# Patient Record
Sex: Male | Born: 1975 | Hispanic: Yes | Marital: Single | Smoking: Never smoker
Health system: Southern US, Community
[De-identification: ages and names within clinical notes are randomized; demographics above are authoritative.]

## PROBLEM LIST (undated history)

## (undated) DIAGNOSIS — F32A Depression, unspecified: Secondary | ICD-10-CM

## (undated) DIAGNOSIS — K2970 Gastritis, unspecified, without bleeding: Secondary | ICD-10-CM

---

## 2021-04-04 ENCOUNTER — Other Ambulatory Visit: Payer: Self-pay

## 2021-04-04 ENCOUNTER — Ambulatory Visit (HOSPITAL_COMMUNITY)
Admission: EM | Admit: 2021-04-04 | Discharge: 2021-04-04 | Disposition: A | Discharge status: Home / Self Care | Payer: Self-pay | Attending: Family Medicine | Admitting: Family Medicine

## 2021-04-04 ENCOUNTER — Encounter (HOSPITAL_COMMUNITY): Payer: Self-pay

## 2021-04-04 DIAGNOSIS — R519 Headache, unspecified: Secondary | ICD-10-CM

## 2021-04-04 DIAGNOSIS — R030 Elevated blood-pressure reading, without diagnosis of hypertension: Secondary | ICD-10-CM

## 2021-04-04 MED ORDER — AMLODIPINE BESYLATE 2.5 MG PO TABS: 2.5000 mg | ORAL_TABLET | Freq: Every day | ORAL | 0 refills | Status: DC

## 2021-04-04 NOTE — ED Triage Notes (Addendum)
Pt reports frequent headaches along with pain in back of neck lately and would like blood pressure checked. Reports has been having headaches for about 2 days. Denies dizziness or visual abnormalities but endorses a lot of sweating.

## 2021-04-04 NOTE — ED Notes (Signed)
Triage completed with assistance from spanish interpreter Katherinne.

## 2021-04-04 NOTE — ED Provider Notes (Signed)
[facility]    CSN: [id number] Arrival date & time: 04/04/21  0848      History   Chief Complaint Chief Complaint  Patient presents with   Headache    HPI Jayvion Stefanski is a 45 y.o. male.   Medical interpreter utilized today to facilitate visit with patient's consent.  Patient states he is concerned about his blood pressure being high and has had posterior headaches the past 2 days that have been mild and intermittent.  He states when he lived in [place] he used to have issues during the summer months when it was hot outside with his blood pressure elevating and he would have similar headaches.  He has not seen a primary care since that time and has been off medications.  Does not recall what medication he used to take but states he used to take something for a period of time for his elevated blood pressure in [place].  He denies vision changes, dizziness, chest pain, shortness of breath, palpitations, recent injuries to the neck or head, worsening pain with movement.  Has not tried anything over-the-counter for symptoms thus far.   History reviewed. No pertinent past medical history.  There are no problems to display for this patient.   History reviewed. No pertinent surgical history.     Home Medications    Prior to Admission medications   Medication Sig Start Date End Date Taking? Authorizing Provider  amLODipine (NORVASC) 2.5 MG tablet Take 1 tablet (2.5 mg total) by mouth daily. 04/04/21  Yes Particia Nearing, PA-C    Family History History reviewed. No pertinent family history.  Social History Social History   Tobacco Use   Smoking status: Never   Smokeless tobacco: Never  Substance Use Topics   Alcohol use: Yes    Comment: occasional beer   Drug use: Not Currently     Allergies   Patient has no known allergies.   Review of Systems Review of Systems Per HPI  Physical Exam Triage Vital Signs ED Triage Vitals  Enc Vitals  Group     BP 04/04/21 0908 (!) 147/95     Pulse Rate 04/04/21 0908 81     Resp 04/04/21 0908 18     Temp 04/04/21 0908 99.6 F (37.6 C)     Temp Source 04/04/21 0908 Oral     SpO2 04/04/21 0908 97 %     Weight --      Height --      Head Circumference --      Peak Flow --      Pain Score 04/04/21 0909 3     Pain Loc --      Pain Edu? --      Excl. in GC? --    No data found.  Updated Vital Signs BP (!) 147/95   Pulse 81   Temp 99.6 F (37.6 C) (Oral)   Resp 18   SpO2 97%   Visual Acuity Right Eye Distance:   Left Eye Distance:   Bilateral Distance:    Right Eye Near:   Left Eye Near:    Bilateral Near:     Physical Exam Vitals and nursing note reviewed.  Constitutional:      Appearance: Normal appearance.  HENT:     Head: Atraumatic.     Mouth/Throat:     Mouth: Mucous membranes are moist.  Eyes:     Extraocular Movements: Extraocular movements intact.     Conjunctiva/sclera: Conjunctivae normal.  Cardiovascular:     Rate and Rhythm: Normal rate and regular rhythm.     Heart sounds: Normal heart sounds.  Pulmonary:     Effort: Pulmonary effort is normal.     Breath sounds: Normal breath sounds. No wheezing or rales.  Musculoskeletal:        General: No swelling or tenderness. Normal range of motion.     Cervical back: Normal range of motion and neck supple.     Comments: No midline C-spine tenderness palpation, good range of motion in all directions.  Strength full and equal upper extremities bilaterally  Skin:    General: Skin is warm and dry.  Neurological:     General: No focal deficit present.     Mental Status: He is oriented to person, place, and time.     Cranial Nerves: No cranial nerve deficit.     Motor: No weakness.     Gait: Gait normal.  Psychiatric:        Mood and Affect: Mood normal.        Thought Content: Thought content normal.        Judgment: Judgment normal.     UC Treatments / Results  Labs (all labs ordered are listed,  but only abnormal results are displayed) Labs Reviewed - No data to display  EKG   Radiology No results found.  Procedures Procedures (including critical care time)  Medications Ordered in UC Medications - No data to display  Initial Impression / Assessment and Plan / UC Course  I have reviewed the triage vital signs and the nursing notes.  Pertinent labs & imaging results that were available during my care of the patient were reviewed by me and considered in my medical decision making (see chart for details).     Blood pressure mildly elevated today, significant past medical history available for review and comparison.  Patient subjectively states he has been on blood pressure medication in the past in [place] and has episodic issues with elevated blood pressure readings and historically has similar headaches with this.  He does not feel his headache is muscular at this time, he feels very confident that is related to his blood pressure.  Discussed concerns about safety issues with possibly dropping blood pressure too low by treating a very mildly elevated blood pressure.  He is agreeable to trying a very low-dose of amlodipine at 2.5 mg and closely monitoring home blood pressures.  PCP assistance initiated to help with follow-up and ongoing monitoring.  Over-the-counter pain relievers also recommended for his headache and he knows to follow-up immediately if symptoms worsening or not improving.  Final Clinical Impressions(s) / UC Diagnoses   Final diagnoses:  Elevated blood pressure reading  Acute nonintractable headache, unspecified headache type   Discharge Instructions   None    ED Prescriptions     Medication Sig Dispense Auth. Provider   amLODipine (NORVASC) 2.5 MG tablet Take 1 tablet (2.5 mg total) by mouth daily. 14 tablet Particia Nearing, [place]      PDMP not reviewed this encounter.   Particia Nearing, [place] 04/04/21 1206

## 2021-10-30 ENCOUNTER — Other Ambulatory Visit: Payer: Self-pay

## 2021-10-30 ENCOUNTER — Emergency Department (HOSPITAL_COMMUNITY)
Admission: EM | Admit: 2021-10-30 | Discharge: 2021-10-30 | Disposition: A | Discharge status: Home / Self Care | Payer: Self-pay | Attending: Emergency Medicine | Admitting: Emergency Medicine

## 2021-10-30 ENCOUNTER — Emergency Department (HOSPITAL_COMMUNITY): Payer: Self-pay

## 2021-10-30 ENCOUNTER — Encounter (HOSPITAL_COMMUNITY): Payer: Self-pay

## 2021-10-30 DIAGNOSIS — K2100 Gastro-esophageal reflux disease with esophagitis, without bleeding: Secondary | ICD-10-CM | POA: Insufficient documentation

## 2021-10-30 DIAGNOSIS — Z79899 Other long term (current) drug therapy: Secondary | ICD-10-CM | POA: Insufficient documentation

## 2021-10-30 LAB — BASIC METABOLIC PANEL
Anion gap: 4 — ABNORMAL LOW (ref 5–15)
BUN: 15 mg/dL (ref 6–20)
CO2: 27 mmol/L (ref 22–32)
Calcium: 8.6 mg/dL — ABNORMAL LOW (ref 8.9–10.3)
Chloride: 104 mmol/L (ref 98–111)
Creatinine, Ser: 0.92 mg/dL (ref 0.61–1.24)
GFR, Estimated: >60 mL/min (ref >60)
Glucose, Bld: 136 mg/dL — ABNORMAL HIGH (ref 70–99)
Potassium: 3.8 mmol/L (ref 3.5–5.1)
Sodium: 135 mmol/L (ref 135–145)

## 2021-10-30 LAB — CBC
HCT: 50.3 % (ref 39.0–52.0)
Hemoglobin: 17.5 g/dL — ABNORMAL HIGH (ref 13.0–17.0)
MCH: 31.8 pg (ref 26.0–34.0)
MCHC: 34.8 g/dL (ref 30.0–36.0)
MCV: 91.5 fL (ref 80.0–100.0)
Platelets: 240 10*3/uL (ref 150–400)
RBC: 5.5 MIL/uL (ref 4.22–5.81)
RDW: 12.5 % (ref 11.5–15.5)
WBC: 5.8 10*3/uL (ref 4.0–10.5)
nRBC: 0 % (ref 0.0–0.2)

## 2021-10-30 LAB — TROPONIN I (HIGH SENSITIVITY): Troponin I (High Sensitivity): 3 ng/L (ref <18)

## 2021-10-30 MED ORDER — PANTOPRAZOLE SODIUM 40 MG PO TBEC
40.0000 mg | DELAYED_RELEASE_TABLET | Freq: Once | ORAL | Status: AC
  Administered 2021-10-30: 40 mg via ORAL
  Filled 2021-10-30: qty 1

## 2021-10-30 MED ORDER — OMEPRAZOLE 40 MG PO CPDR: 40.0000 mg | DELAYED_RELEASE_CAPSULE | Freq: Every day | ORAL | 0 refills | Status: DC

## 2021-10-30 NOTE — ED Triage Notes (Addendum)
Pt presents to ED with complaints of chest pain on the right side of his chest. Pt states feels numb on the right side of his chest. Pt states he had an injury (fall on wood) on the same side a year ago and unsure if it is related. Pt states pain started 2 days ago. Interpreter [phone]

## 2021-10-30 NOTE — ED Provider Notes (Signed)
[facility] EMERGENCY DEPARTMENT Provider Note   CSN: [id number] Arrival date & time: 10/30/21  1515     History  Chief Complaint  Patient presents with   Chest Pain    Barry Zamora is a 46 y.o. male.   Chest Pain Associated symptoms: no cough, no dysphagia, no fever, no nausea, no palpitations, no shortness of breath and no vomiting      Barry Zamora is a 46 y.o. male who presents to the Emergency Department complaining of burning chest pain and throat pain.  Symptoms began 2 days ago.  He describes having a tingling sensation on the right side of his chest as well.  He reports similar symptoms in the past in which he was told he had gastritis and prescribed Prilosec which helped temporarily but he ran out of the medication ans does not currently have a PCP.  He is also concerned that he fell and injured his chest a year ago and he wonders if the injury has anything to do with his current symptoms   Home Medications Prior to Admission medications   Medication Sig Start Date End Date Taking? Authorizing Provider  amLODipine (NORVASC) 2.5 MG tablet Take 1 tablet (2.5 mg total) by mouth daily. 04/04/21   Particia Nearing, PA-C      Allergies    Patient has no known allergies.    Review of Systems   Review of Systems  Constitutional:  Negative for chills and fever.  HENT:  Negative for sore throat and trouble swallowing.   Respiratory:  Negative for cough, shortness of breath and wheezing.   Cardiovascular:  Positive for chest pain. Negative for palpitations.  Gastrointestinal:  Negative for diarrhea, nausea and vomiting.  Skin:  Negative for rash.  All other systems reviewed and are negative.  Physical Exam Updated Vital Signs BP 130/78 (BP Location: Left Arm)    Pulse 66    Temp 99.4 F (37.4 C) (Oral)    Resp 16    Ht 5' 4.96" (1.65 m)    Wt 84.5 kg    SpO2 98%    BMI 31.02 kg/m  Physical Exam Vitals and nursing note reviewed.   Constitutional:      Appearance: Normal appearance. He is well-developed. He is not ill-appearing, toxic-appearing or diaphoretic.  HENT:     Mouth/Throat:     Mouth: Mucous membranes are moist.     Pharynx: Oropharynx is clear.  Cardiovascular:     Rate and Rhythm: Normal rate and regular rhythm.     Pulses: Normal pulses.  Pulmonary:     Effort: Pulmonary effort is normal. No respiratory distress.  Abdominal:     Palpations: Abdomen is soft.     Tenderness: There is no abdominal tenderness.  Musculoskeletal:        General: Normal range of motion.     Right lower leg: No edema.     Left lower leg: No edema.  Skin:    General: Skin is warm.     Findings: No rash.  Neurological:     General: No focal deficit present.     Mental Status: He is alert.     Motor: No weakness.  Psychiatric:        Mood and Affect: Mood normal.    ED Results / Procedures / Treatments   Labs (all labs ordered are listed, but only abnormal results are displayed) Labs Reviewed  BASIC METABOLIC PANEL - Abnormal; Notable for the following components:  Result Value   Glucose, Bld 136 (*)    Calcium 8.6 (*)    Anion gap 4 (*)    All other components within normal limits  CBC - Abnormal; Notable for the following components:   Hemoglobin 17.5 (*)    All other components within normal limits  TROPONIN I (HIGH SENSITIVITY)  TROPONIN I (HIGH SENSITIVITY)    EKG EKG Interpretation  Date/Time:  Wednesday October 30 2021 15:37:17 EST Ventricular Rate:  73 PR Interval:  126 QRS Duration: 92 QT Interval:  384 QTC Calculation: 423 R Axis:   91 Text Interpretation: Normal sinus rhythm Rightward axis Borderline ECG No previous ECGs available Confirmed by Mancel Bale [phone]) on 10/30/2021 3:42:07 PM  Radiology DG Chest 2 View  Result Date: 10/30/2021 CLINICAL DATA:  Right-sided chest pain, chest numbness, history of remote trauma EXAM: CHEST - 2 VIEW COMPARISON:  None. FINDINGS: Frontal and  lateral views of the chest demonstrate an unremarkable cardiac silhouette. No acute airspace disease, effusion, or pneumothorax. There are no acute bony abnormalities. IMPRESSION: 1. No acute intrathoracic process. Electronically Signed   By: Sharlet Salina M.D.   On: 10/30/2021 16:19    Procedures Procedures    Medications Ordered in ED Medications - No data to display  ED Course/ Medical Decision Making/ A&P                           Medical Decision Making Patient here with 3-day history of chest pain.  Describes pain as burning sensation from abdomen up through his chest.  Symptoms wax and wane.  No prior history of coronary artery disease.  Patient is a non-smoker.  Differential diagnosis would include MI, coronary artery disease, PE, GERD,  Denies any chest pain at this time.  Amount and/or Complexity of Data Reviewed Independent Historian:     Details: History provided by patient through interpreter External Data Reviewed:     Details: No external data available for review Labs: ordered.    Details: Labs interpreted by me show no leukocytosis, electrolytes reassuring, blood glucose is 136.  No history of diabetes.  troponin of 3 Radiology: ordered. ECG/medicine tests:  Decision-making details documented in ED Course.  Risk Prescription drug management.   Patient has heart score of 1  Pt with likely esophagitis.  Doubt cardiac process.  EKG and trop reassuring.  Will start him back on his Prilosec and he will be given f/u info for him to establish primary care.  He agrees to plan.  Return precautions discussed.          Final Clinical Impression(s) / ED Diagnoses Final diagnoses:  Gastroesophageal reflux disease with esophagitis without hemorrhage    Rx / DC Orders ED Discharge Orders     None         Pauline Aus, PA-C 11/01/21 2319    Mancel Bale, MD 11/02/21 2309

## 2021-10-30 NOTE — Discharge Instructions (Addendum)
Take the medication as directed.  You may call the provider listed to arrange an appointment to establish primary care.  Please return to the emergency department for any new or worsening symptoms.

## 2022-07-19 ENCOUNTER — Emergency Department (HOSPITAL_COMMUNITY)
Admission: EM | Admit: 2022-07-19 | Discharge: 2022-07-19 | Disposition: A | Discharge status: Home / Self Care | Payer: Self-pay | Attending: Emergency Medicine | Admitting: Emergency Medicine

## 2022-07-19 ENCOUNTER — Encounter (HOSPITAL_COMMUNITY): Payer: Self-pay

## 2022-07-19 ENCOUNTER — Emergency Department (HOSPITAL_COMMUNITY): Payer: Self-pay

## 2022-07-19 ENCOUNTER — Other Ambulatory Visit: Payer: Self-pay

## 2022-07-19 DIAGNOSIS — K2900 Acute gastritis without bleeding: Secondary | ICD-10-CM | POA: Insufficient documentation

## 2022-07-19 DIAGNOSIS — K2970 Gastritis, unspecified, without bleeding: Secondary | ICD-10-CM

## 2022-07-19 HISTORY — DX: Gastritis, unspecified, without bleeding: K29.70

## 2022-07-19 LAB — CBC
HCT: 50.9 % (ref 39.0–52.0)
Hemoglobin: 18.2 g/dL — ABNORMAL HIGH (ref 13.0–17.0)
MCH: 31.9 pg (ref 26.0–34.0)
MCHC: 35.8 g/dL (ref 30.0–36.0)
MCV: 89.1 fL (ref 80.0–100.0)
Platelets: 241 10*3/uL (ref 150–400)
RBC: 5.71 MIL/uL (ref 4.22–5.81)
RDW: 12.1 % (ref 11.5–15.5)
WBC: 6.5 10*3/uL (ref 4.0–10.5)
nRBC: 0 % (ref 0.0–0.2)

## 2022-07-19 LAB — URINALYSIS, ROUTINE W REFLEX MICROSCOPIC
Bilirubin Urine: NEGATIVE
Glucose, UA: NEGATIVE mg/dL
Hgb urine dipstick: NEGATIVE
Ketones, ur: NEGATIVE mg/dL
Leukocytes,Ua: NEGATIVE
Nitrite: NEGATIVE
Protein, ur: NEGATIVE mg/dL
Specific Gravity, Urine: 1.005 (ref 1.005–1.030)
pH: 7 (ref 5.0–8.0)

## 2022-07-19 LAB — COMPREHENSIVE METABOLIC PANEL
ALT: 26 U/L (ref 0–44)
AST: 24 U/L (ref 15–41)
Albumin: 4.6 g/dL (ref 3.5–5.0)
Alkaline Phosphatase: 65 U/L (ref 38–126)
Anion gap: 6 (ref 5–15)
BUN: 13 mg/dL (ref 6–20)
CO2: 28 mmol/L (ref 22–32)
Calcium: 9.2 mg/dL (ref 8.9–10.3)
Chloride: 103 mmol/L (ref 98–111)
Creatinine, Ser: 0.98 mg/dL (ref 0.61–1.24)
GFR, Estimated: >60 mL/min (ref >60)
Glucose, Bld: 118 mg/dL — ABNORMAL HIGH (ref 70–99)
Potassium: 3.5 mmol/L (ref 3.5–5.1)
Sodium: 137 mmol/L (ref 135–145)
Total Bilirubin: 1.1 mg/dL (ref 0.3–1.2)
Total Protein: 8.1 g/dL (ref 6.5–8.1)

## 2022-07-19 LAB — LIPASE, BLOOD: Lipase: 36 U/L (ref 11–51)

## 2022-07-19 MED ORDER — ALUM & MAG HYDROXIDE-SIMETH 200-200-20 MG/5ML PO SUSP
30.0000 mL | Freq: Once | ORAL | Status: AC
  Administered 2022-07-19: 30 mL via ORAL
  Filled 2022-07-19: qty 30

## 2022-07-19 MED ORDER — IOHEXOL 300 MG/ML  SOLN
100.0000 mL | Freq: Once | INTRAMUSCULAR | Status: AC | PRN
  Administered 2022-07-19: 100 mL via INTRAVENOUS

## 2022-07-19 MED ORDER — PANTOPRAZOLE SODIUM 20 MG PO TBEC: 20.0000 mg | DELAYED_RELEASE_TABLET | Freq: Every day | ORAL | 2 refills | Status: DC

## 2022-07-19 NOTE — ED Triage Notes (Signed)
Pt reports hx of gastritis. Reports stomach ache. Pt is requesting a [place] and blood work. Pt reports increase in pain. Pt wants to be checked for an ulcer or a possible appendix issue.   Denies n/v

## 2022-07-19 NOTE — ED Notes (Signed)
Patient transported to CT 

## 2022-07-19 NOTE — ED Provider Notes (Signed)
[facility] EMERGENCY DEPARTMENT Provider Note   CSN: [id number] Arrival date & time: 07/19/22  1223     History  Chief Complaint  Patient presents with   Abdominal Pain    Margarita Bobrowski is a 46 y.o. male.  Patient complains of abdominal pain.  Patient reports he feels like he has something wrong.  Patient thinks he may have an ulcer or something wrong with his appendix.  Patient reports he has been on medication in the past for gastritis.  Patient denies fever or chills he denies nausea or vomiting he denies any diarrhea.  Patient denies any burning with urination.  The history is provided by the patient. The history is limited by a language barrier. A language interpreter was used.  Abdominal Pain Pain location:  Epigastric and periumbilical Pain quality: aching   Pain radiates to:  Does not radiate Pain severity:  Moderate Onset quality:  Gradual Duration:  4 days Timing:  Constant Progression:  Worsening Chronicity:  New Context: not alcohol use, not diet changes and not suspicious food intake   Relieved by:  Nothing Worsened by:  Nothing Risk factors: no alcohol abuse        Home Medications Prior to Admission medications   Medication Sig Start Date End Date Taking? Authorizing Provider  pantoprazole (PROTONIX) 20 MG tablet Take 1 tablet (20 mg total) by mouth daily. 07/19/22 07/19/23 Yes Cheron Schaumann K, PA-C  amLODipine (NORVASC) 2.5 MG tablet Take 1 tablet (2.5 mg total) by mouth daily. 04/04/21   Particia Nearing, PA-C  omeprazole (PRILOSEC) 40 MG capsule Take 1 capsule (40 mg total) by mouth daily. 10/30/21   Triplett, Tammy, PA-C      Allergies    Patient has no known allergies.    Review of Systems   Review of Systems  Gastrointestinal:  Positive for abdominal pain.  All other systems reviewed and are negative.   Physical Exam Updated Vital Signs BP 128/78 (BP Location: Left Arm)   Pulse 61   Temp 99 F (37.2 C) (Oral)   Resp  16   Ht 5\' 4"  (1.626 m)   Wt 81.4 kg   SpO2 99%   BMI 30.79 kg/m  Physical Exam Vitals and nursing note reviewed.  Constitutional:      Appearance: He is well-developed.  HENT:     Head: Normocephalic.  Cardiovascular:     Rate and Rhythm: Normal rate and regular rhythm.  Pulmonary:     Effort: Pulmonary effort is normal.  Abdominal:     General: Bowel sounds are normal. There is abdominal bruit. There is no distension.     Palpations: Abdomen is soft.     Tenderness: There is abdominal tenderness.  Musculoskeletal:        General: Normal range of motion.     Cervical back: Normal range of motion.  Skin:    General: Skin is warm.  Neurological:     General: No focal deficit present.     Mental Status: He is alert and oriented to person, place, and time.  Psychiatric:        Mood and Affect: Mood normal.     ED Results / Procedures / Treatments   Labs (all labs ordered are listed, but only abnormal results are displayed) Labs Reviewed  COMPREHENSIVE METABOLIC PANEL - Abnormal; Notable for the following components:      Result Value   Glucose, Bld 118 (*)    All other components within normal limits  CBC - Abnormal; Notable for the following components:   Hemoglobin 18.2 (*)    All other components within normal limits  URINALYSIS, ROUTINE W REFLEX MICROSCOPIC - Abnormal; Notable for the following components:   Color, Urine STRAW (*)    All other components within normal limits  LIPASE, BLOOD    EKG None  Radiology CT ABDOMEN PELVIS W CONTRAST  Result Date: 07/19/2022 CLINICAL DATA:  Abdominal pain, acute nonlocalized. EXAM: CT ABDOMEN AND PELVIS WITH CONTRAST TECHNIQUE: Multidetector CT imaging of the abdomen and pelvis was performed using the standard protocol following bolus administration of intravenous contrast. RADIATION DOSE REDUCTION: This exam was performed according to the departmental dose-optimization program which includes automated exposure  control, adjustment of the mA and/or kV according to patient size and/or use of iterative reconstruction technique. CONTRAST:  149mL OMNIPAQUE IOHEXOL 300 MG/ML  SOLN COMPARISON:  None Available. FINDINGS: Lower chest: No acute abnormality. Hepatobiliary: Bilobar tiny hypodense hepatic lesions are technically too small to accurately characterize but statistically likely to reflect a benign etiology such as cysts or hemangiomas. Gallbladder is unremarkable. No biliary ductal dilation. Pancreas: Mild stranding along the pancreaticoduodenal groove. No pancreatic ductal dilation. Spleen: No splenomegaly. Adrenals/Urinary Tract: Bilateral adrenal glands appear normal. No hydronephrosis. Kidneys demonstrate symmetric enhancement. Urinary bladder is unremarkable for degree of distension. Stomach/Bowel: No radiopaque enteric contrast material was administered. Mild wall thickening versus underdistention of the gastric antrum. No pathologic dilation of small or large bowel. The appendix and terminal ileum appear normal. No evidence of acute bowel inflammation. Vascular/Lymphatic: Normal caliber abdominal aorta. No pathologically enlarged abdominal or pelvic lymph nodes. Reproductive: Prostate is unremarkable. Other: No significant abdominopelvic free fluid. Musculoskeletal: No acute osseous abnormality. Lower lumbar spondylosis. IMPRESSION: Mild wall thickening versus underdistention of the gastric antrum. Correlate for gastritis. If continued concern for ulcerative disease would suggest further evaluation with nonemergent outpatient upper endoscopy. Mild stranding along the pancreaticoduodenal groove may reflect pancreatitis or duodenitis, correlation with serum lipase suggested. Electronically Signed   By: Dahlia Bailiff M.D.   On: 07/19/2022 15:27    Procedures Procedures    Medications Ordered in ED Medications  alum & mag hydroxide-simeth (MAALOX/MYLANTA) 200-200-20 MG/5ML suspension 30 mL (30 mLs Oral Given  07/19/22 1445)  iohexol (OMNIPAQUE) 300 MG/ML solution 100 mL (100 mLs Intravenous Contrast Given 07/19/22 1510)    ED Course/ Medical Decision Making/ A&P                           Medical Decision Making Patient complains of increasing abdominal pain for the past 4 days.  Patient reports he has had a history of gastritis in the past and has previously been on medications.  he reports his pain has not been this bad previously.  Amount and/or Complexity of Data Reviewed External Data Reviewed: notes.    Details: Previous ED and urgent care notes reviewed Labs: ordered. Decision-making details documented in ED Course.    Details: Labs ordered reviewed and interpreted.  Patient has a elevation of his hemoglobin to 18.2 glucose is 118 patient's lipase is normal Radiology: ordered and independent interpretation performed. Decision-making details documented in ED Course.    Details: CT abdomen is obtained which shows mild wall thickening of the gastric antrum.  Risk OTC drugs. Prescription drug management. Risk Details: Patient is counseled on results he is advised that he needs to follow-up with gastroenterology he is advised to call Monday for an appointment.  Patient is given  a prescription for Protonix and referral information for Dr. Marletta Lor GI on call.           Final Clinical Impression(s) / ED Diagnoses Final diagnoses:  Acute gastritis without hemorrhage, unspecified gastritis type    Rx / DC Orders ED Discharge Orders          Ordered    pantoprazole (PROTONIX) 20 MG tablet  Daily        07/19/22 1541           An After Visit Summary was printed and given to the patient.    Osie Cheeks 07/19/22 2233    Lonell Grandchild, MD 07/20/22 [phone]

## 2022-07-19 NOTE — ED Notes (Addendum)
Charge nurse Vaughan Basta at bedside to attempt IV placement. Video interpreter used to explain process.

## 2022-07-19 NOTE — Discharge Instructions (Signed)
Agendar cita con el gastroenterologo para evaluation

## 2022-12-06 ENCOUNTER — Other Ambulatory Visit: Payer: Self-pay

## 2022-12-06 ENCOUNTER — Encounter (HOSPITAL_COMMUNITY): Payer: Self-pay | Admitting: *Deleted

## 2022-12-06 ENCOUNTER — Emergency Department (HOSPITAL_COMMUNITY)
Admission: EM | Admit: 2022-12-06 | Discharge: 2022-12-06 | Disposition: A | Discharge status: Home / Self Care | Payer: Self-pay | Attending: Emergency Medicine | Admitting: Emergency Medicine

## 2022-12-06 DIAGNOSIS — Z79899 Other long term (current) drug therapy: Secondary | ICD-10-CM | POA: Insufficient documentation

## 2022-12-06 DIAGNOSIS — R300 Dysuria: Secondary | ICD-10-CM | POA: Insufficient documentation

## 2022-12-06 DIAGNOSIS — I10 Essential (primary) hypertension: Secondary | ICD-10-CM | POA: Insufficient documentation

## 2022-12-06 LAB — URINALYSIS, W/ REFLEX TO CULTURE (INFECTION SUSPECTED)
Bacteria, UA: NONE SEEN
Bilirubin Urine: NEGATIVE
Glucose, UA: NEGATIVE mg/dL
Hgb urine dipstick: NEGATIVE
Ketones, ur: NEGATIVE mg/dL
Leukocytes,Ua: NEGATIVE
Nitrite: NEGATIVE
Protein, ur: NEGATIVE mg/dL
Specific Gravity, Urine: 1.013 (ref 1.005–1.030)
pH: 7 (ref 5.0–8.0)

## 2022-12-06 MED ORDER — PHENAZOPYRIDINE HCL 200 MG PO TABS: 200.0000 mg | ORAL_TABLET | Freq: Three times a day (TID) | ORAL | 0 refills | Status: DC | PRN

## 2022-12-06 MED ORDER — CEFADROXIL 500 MG PO CAPS: 500.0000 mg | ORAL_CAPSULE | Freq: Two times a day (BID) | ORAL | 0 refills | Status: DC

## 2022-12-06 NOTE — Discharge Instructions (Addendum)
Note the workup today was overall reassuring.  Urine was without infection but given your symptoms, will treat empirically for urinary tract infection.  Take antibiotic as directed.  Pyridium to be used as needed for burning sensation.  Follow MyChart for the results of her gonorrhea/chlamydia results and seek appropriate treatment by primary care, urgent care or emergency department if you test positive.  Please do not hesitate to return to emergency department if the worrisome signs and symptoms we discussed become apparent.  Tenga en cuenta que el anlisis de hoy fue en general tranquilizador. La orina no tena infeccin, pero dados sus sntomas, se tratar empricamente la infeccin del tracto urinario. Foraker. Pyridium se utilizar segn sea necesario para la sensacin de ardor. Siga MyChart para Research scientist (physical sciences) de gonorrea/clamidia y busque el tratamiento adecuado en atencin primaria, atencin de [place] o departamento de emergencias si el resultado es positivo. No dude en regresar al departamento de emergencias si los signos y sntomas preocupantes que comentamos se vuelven evidentes.

## 2022-12-06 NOTE — ED Provider Notes (Signed)
[facility] Provider Note   CSN: [id number] Arrival date & time: 12/06/22  [id number]     History  No chief complaint on file.   Barry Zamora is a 47 y.o. male.  HPI   47 year old male presents emergency department with complaints of dysuria.  Patient states symptoms been present for the past 8 days.  States he noticed most of burning near the end of urinating.  Denies history of similar symptoms in the past.  States that he had left-sided flank pain approximately 1 month ago that lasted for about a day that is since resolved and has not been experienced again.  Denies fever, chills, abdominal pain, nausea, vomiting, penile discharge, penile trauma, feelings of urinary retention/frequency, rash, change in bowel habits.  Patient reports monogamous relationship but wants to be tested for gonorrhea/chlamydia.  No significant pertinent past medical history.  Home Medications Prior to Admission medications   Medication Sig Start Date End Date Taking? Authorizing Provider  cefadroxil (DURICEF) 500 MG capsule Take 1 capsule (500 mg total) by mouth 2 (two) times daily. 12/06/22  Yes Dion Saucier A, PA  phenazopyridine (PYRIDIUM) 200 MG tablet Take 1 tablet (200 mg total) by mouth 3 (three) times daily as needed for pain. 12/06/22  Yes Dion Saucier A, PA  amLODipine (NORVASC) 2.5 MG tablet Take 1 tablet (2.5 mg total) by mouth daily. 04/04/21   Volney American, PA-C  omeprazole (PRILOSEC) 40 MG capsule Take 1 capsule (40 mg total) by mouth daily. 10/30/21   Triplett, Tammy, PA-C  pantoprazole (PROTONIX) 20 MG tablet Take 1 tablet (20 mg total) by mouth daily. 07/19/22 07/19/23  Fransico Meadow, PA-C      Allergies    Patient has no known allergies.    Review of Systems   Review of Systems  All other systems reviewed and are negative.   Physical Exam Updated Vital Signs BP (!) 149/67 (BP Location: Left Arm)   Pulse 71   Temp  98.2 F (36.8 C) (Oral)   Resp 16   Wt 76 kg   SpO2 100%   BMI 28.76 kg/m  Physical Exam Vitals and nursing note reviewed.  Constitutional:      General: He is not in acute distress.    Appearance: He is well-developed.  HENT:     Head: Normocephalic and atraumatic.  Eyes:     Conjunctiva/sclera: Conjunctivae normal.  Cardiovascular:     Rate and Rhythm: Normal rate and regular rhythm.     Heart sounds: No murmur heard. Pulmonary:     Effort: Pulmonary effort is normal. No respiratory distress.     Breath sounds: Normal breath sounds. No wheezing, rhonchi or rales.  Abdominal:     Palpations: Abdomen is soft.     Tenderness: There is no abdominal tenderness. There is no right CVA tenderness, left CVA tenderness, guarding or rebound.  Musculoskeletal:        General: No swelling.     Cervical back: Neck supple.  Skin:    General: Skin is warm and dry.     Capillary Refill: Capillary refill takes less than 2 seconds.  Neurological:     Mental Status: He is alert.  Psychiatric:        Mood and Affect: Mood normal.     ED Results / Procedures / Treatments   Labs (all labs ordered are listed, but only abnormal results are displayed) Labs Reviewed  URINALYSIS, W/ REFLEX TO CULTURE (  INFECTION SUSPECTED) - Abnormal; Notable for the following components:      Result Value   Color, Urine STRAW (*)    All other components within normal limits  GC/CHLAMYDIA PROBE AMP () NOT AT [facility]    EKG None  Radiology No results found.  Procedures Procedures    Medications Ordered in ED Medications - No data to display  ED Course/ Medical Decision Making/ A&P                             Medical Decision Making  This patient presents to the ED for concern of dysuria, this involves an extensive number of treatment options, and is a complaint that carries with it a high risk of complications and morbidity.  The differential diagnosis includes cystitis,  pyelonephritis, gonorrhea, chlamydia, HIV, syphilis, prostatitis, epididymitis, urethral stricture/obstruction, traumatic mechanism  Co morbidities that complicate the patient evaluation  See HPI   Additional history obtained:  Additional history obtained from EMR External records from outside source obtained and reviewed including hospital records   Lab Tests:  I Ordered, and personally interpreted labs.  The pertinent results include: UA significant for straw-colored appearance but otherwise unremarkable.  GC/chlamydia pending.   Imaging Studies ordered:  N/a   Cardiac Monitoring: / EKG:  The patient was maintained on a cardiac monitor.  I personally viewed and interpreted the cardiac monitored which showed an underlying rhythm of: Sinus rhythm   Consultations Obtained:  N/a   Problem List / ED Course / Critical interventions / Medication management  Dysuria Reevaluation of the patient showed that the patient stayed the same I have reviewed the patients home medicines and have made adjustments as needed   Social Determinants of Health:  Denies tobacco, illicit drug use.   Test / Admission - Considered:  Dysuria Vitals signs significant for mild hypertension with blood pressure 149/67.  Recommend follow-up with primary care regarding ablation blood pressure.. Otherwise within normal range and stable throughout visit. Laboratory studies significant for: See above 47 year old male presents emergency department with complaints of 8-day history of dysuria.  Urinalysis today without signs of infection.  GC/chlamydia probe pending at this time.  At this time, unsure of exact etiology of patient's symptoms.  Given monogamous status and low suspicion for STD, patient declined STD empiric treatment at this time.  Will treat empirically for cystitis although urinalysis reassuring.  Low suspicion for pyelonephritis, nephrolithiasis, given lack of intra-abdominal tenderness or  CVA tenderness.  Possible urethral stricture or mechanical trauma.  Recommend follow-up with urology outpatient for reassessment of symptoms.  In the meantime, Pyridium to be used as needed for symptoms of dysuria.  Treatment plan discussed at length with patient and he acknowledged understanding was agreeable to said plan. Worrisome signs and symptoms were discussed with the patient, and the patient acknowledged understanding to return to the ED if noticed. Patient was stable upon discharge.          Final Clinical Impression(s) / ED Diagnoses Final diagnoses:  Dysuria    Rx / DC Orders ED Discharge Orders          Ordered    cefadroxil (DURICEF) 500 MG capsule  2 times daily        12/06/22 0947    phenazopyridine (PYRIDIUM) 200 MG tablet  3 times daily PRN        12/06/22 0947  Wilnette Kales, [place] 12/06/22 1017    Noemi Chapel, MD 12/07/22 [phone]

## 2022-12-06 NOTE — ED Triage Notes (Addendum)
Pt c/o burning pain after he urinates x 8 days. Denies fever, n/v. Pt does report left flank pain a few days ago, but none currently.   Spanish interpreter used - Raphael Gibney ID# [id number]

## 2022-12-06 NOTE — ED Notes (Signed)
Discharge explained with translator. Patient denies any additional questions.

## 2022-12-08 LAB — GC/CHLAMYDIA PROBE AMP (~~LOC~~) NOT AT ARMC
Chlamydia: NEGATIVE
Comment: NEGATIVE
Comment: NORMAL
Neisseria Gonorrhea: NEGATIVE

## 2022-12-12 ENCOUNTER — Emergency Department (HOSPITAL_COMMUNITY)
Admission: EM | Admit: 2022-12-12 | Discharge: 2022-12-12 | Disposition: A | Discharge status: Home / Self Care | Payer: Self-pay | Attending: Emergency Medicine | Admitting: Emergency Medicine

## 2022-12-12 ENCOUNTER — Encounter (HOSPITAL_COMMUNITY): Payer: Self-pay | Admitting: *Deleted

## 2022-12-12 ENCOUNTER — Other Ambulatory Visit: Payer: Self-pay

## 2022-12-12 DIAGNOSIS — N342 Other urethritis: Secondary | ICD-10-CM | POA: Insufficient documentation

## 2022-12-12 DIAGNOSIS — N341 Nonspecific urethritis: Secondary | ICD-10-CM

## 2022-12-12 DIAGNOSIS — Z79899 Other long term (current) drug therapy: Secondary | ICD-10-CM | POA: Insufficient documentation

## 2022-12-12 LAB — URINALYSIS, ROUTINE W REFLEX MICROSCOPIC
Bacteria, UA: NONE SEEN
Bilirubin Urine: NEGATIVE
Glucose, UA: NEGATIVE mg/dL
Hgb urine dipstick: NEGATIVE
Ketones, ur: 5 mg/dL — AB
Leukocytes,Ua: NEGATIVE
Nitrite: NEGATIVE
Protein, ur: 30 mg/dL — AB
Specific Gravity, Urine: 1.03 (ref 1.005–1.030)
pH: 6 (ref 5.0–8.0)

## 2022-12-12 MED ORDER — CEFTRIAXONE SODIUM 500 MG IJ SOLR
500.0000 mg | Freq: Once | INTRAMUSCULAR | Status: AC
  Administered 2022-12-12: 500 mg via INTRAMUSCULAR
  Filled 2022-12-12: qty 500

## 2022-12-12 MED ORDER — DOXYCYCLINE HYCLATE 100 MG PO CAPS: 100.0000 mg | ORAL_CAPSULE | Freq: Two times a day (BID) | ORAL | 0 refills | Status: DC

## 2022-12-12 MED ORDER — STERILE WATER FOR INJECTION IJ SOLN
INTRAMUSCULAR | Status: AC
  Administered 2022-12-12: 10 mL
  Filled 2022-12-12: qty 10

## 2022-12-12 NOTE — Discharge Instructions (Signed)
Return if any problems.

## 2022-12-12 NOTE — ED Triage Notes (Signed)
"  Pain and burning in my penis x 15 days" per pt.

## 2022-12-12 NOTE — ED Notes (Signed)
Patient discharged using translator service

## 2022-12-12 NOTE — ED Provider Notes (Signed)
[facility] Provider Note   CSN: [id number] Arrival date & time: 12/12/22  1727     History  Chief Complaint  Patient presents with   Penile Discharge    Barry Zamora is a 47 y.o. male.  Pt complains of burning with urination.  Pt reports he has had irritation to his penis for 15 days.  Pt reports possible std exposure.    The history is provided by the patient. No language interpreter was used.  Penile Discharge This is a new problem. The problem occurs constantly. Nothing aggravates the symptoms. Nothing relieves the symptoms. He has tried nothing for the symptoms. The treatment provided no relief.       Home Medications Prior to Admission medications   Medication Sig Start Date End Date Taking? Authorizing Provider  doxycycline (VIBRAMYCIN) 100 MG capsule Take 1 capsule (100 mg total) by mouth 2 (two) times daily. 12/12/22  Yes Caryl Ada K, PA-C  amLODipine (NORVASC) 2.5 MG tablet Take 1 tablet (2.5 mg total) by mouth daily. 04/04/21   Volney American, PA-C  cefadroxil (DURICEF) 500 MG capsule Take 1 capsule (500 mg total) by mouth 2 (two) times daily. 12/06/22   Wilnette Kales, PA  omeprazole (PRILOSEC) 40 MG capsule Take 1 capsule (40 mg total) by mouth daily. 10/30/21   Triplett, Tammy, PA-C  pantoprazole (PROTONIX) 20 MG tablet Take 1 tablet (20 mg total) by mouth daily. 07/19/22 07/19/23  Fransico Meadow, PA-C  phenazopyridine (PYRIDIUM) 200 MG tablet Take 1 tablet (200 mg total) by mouth 3 (three) times daily as needed for pain. 12/06/22   Wilnette Kales, PA      Allergies    Patient has no known allergies.    Review of Systems   Review of Systems  Genitourinary:  Positive for penile discharge.  All other systems reviewed and are negative.   Physical Exam Updated Vital Signs BP (!) 164/89 (BP Location: Right Arm)   Pulse 87   Temp 99.3 F (37.4 C) (Oral)   Resp (!) 24   Ht 5\' 4"  (1.626  m)   Wt 76 kg   SpO2 100%   BMI 28.76 kg/m  Physical Exam Vitals and nursing note reviewed.  Constitutional:      Appearance: He is well-developed.  HENT:     Head: Normocephalic.  Cardiovascular:     Rate and Rhythm: Normal rate.  Pulmonary:     Effort: Pulmonary effort is normal.  Abdominal:     General: There is no distension.  Genitourinary:    Penis: Normal.      Comments: No lesion,  thin white discharge  Musculoskeletal:     Cervical back: Normal range of motion.  Neurological:     Mental Status: He is alert and oriented to person, place, and time.  Psychiatric:        Mood and Affect: Mood normal.     ED Results / Procedures / Treatments   Labs (all labs ordered are listed, but only abnormal results are displayed) Labs Reviewed  URINALYSIS, ROUTINE W REFLEX MICROSCOPIC - Abnormal; Notable for the following components:      Result Value   Ketones, ur 5 (*)    Protein, ur 30 (*)    All other components within normal limits  GC/CHLAMYDIA PROBE AMP ([place]) NOT AT [facility]    EKG None  Radiology No results found.  Procedures Procedures    Medications Ordered in ED  Medications  cefTRIAXone (ROCEPHIN) injection 500 mg (has no administration in time range)    ED Course/ Medical Decision Making/ A&P                             Medical Decision Making Pt complains of a penile discharge.  Pt reports possible std exposure   Amount and/or Complexity of Data Reviewed Labs: ordered. Decision-making details documented in ED Course.    Details: Ua is negative  gc and ct pending   Risk Prescription drug management. Risk Details: Pt given rocephin injection.  Pt given rx for doxycycline            Final Clinical Impression(s) / ED Diagnoses Final diagnoses:  Urethritis, nonspecific    Rx / DC Orders ED Discharge Orders          Ordered    doxycycline (VIBRAMYCIN) 100 MG capsule  2 times daily        12/12/22 2035           An  After Visit Summary was printed and given to the patient.    Sidney Ace 12/12/22 2046    Noemi Chapel, MD 12/13/22 1016

## 2022-12-15 ENCOUNTER — Encounter (HOSPITAL_COMMUNITY): Payer: Self-pay | Admitting: *Deleted

## 2022-12-15 ENCOUNTER — Ambulatory Visit (HOSPITAL_COMMUNITY)
Admission: EM | Admit: 2022-12-15 | Discharge: 2022-12-15 | Disposition: A | Discharge status: Home / Self Care | Payer: Self-pay | Attending: Family Medicine | Admitting: Family Medicine

## 2022-12-15 DIAGNOSIS — Z113 Encounter for screening for infections with a predominantly sexual mode of transmission: Secondary | ICD-10-CM | POA: Insufficient documentation

## 2022-12-15 DIAGNOSIS — L509 Urticaria, unspecified: Secondary | ICD-10-CM | POA: Insufficient documentation

## 2022-12-15 DIAGNOSIS — I10 Essential (primary) hypertension: Secondary | ICD-10-CM | POA: Insufficient documentation

## 2022-12-15 DIAGNOSIS — R300 Dysuria: Secondary | ICD-10-CM | POA: Insufficient documentation

## 2022-12-15 DIAGNOSIS — N481 Balanitis: Secondary | ICD-10-CM | POA: Insufficient documentation

## 2022-12-15 LAB — COMPREHENSIVE METABOLIC PANEL
ALT: 20 U/L (ref 0–44)
AST: 25 U/L (ref 15–41)
Albumin: 4.1 g/dL (ref 3.5–5.0)
Alkaline Phosphatase: 59 U/L (ref 38–126)
Anion gap: 8 (ref 5–15)
BUN: 7 mg/dL (ref 6–20)
CO2: 23 mmol/L (ref 22–32)
Calcium: 8.8 mg/dL — ABNORMAL LOW (ref 8.9–10.3)
Chloride: 103 mmol/L (ref 98–111)
Creatinine, Ser: 0.85 mg/dL (ref 0.61–1.24)
GFR, Estimated: >60 mL/min (ref >60)
Glucose, Bld: 120 mg/dL — ABNORMAL HIGH (ref 70–99)
Potassium: 3.5 mmol/L (ref 3.5–5.1)
Sodium: 134 mmol/L — ABNORMAL LOW (ref 135–145)
Total Bilirubin: 1 mg/dL (ref 0.3–1.2)
Total Protein: 7 g/dL (ref 6.5–8.1)

## 2022-12-15 LAB — CBC WITH DIFFERENTIAL/PLATELET
Abs Immature Granulocytes: 0.03 10*3/uL (ref 0.00–0.07)
Basophils Absolute: 0 10*3/uL (ref 0.0–0.1)
Basophils Relative: 0 %
Eosinophils Absolute: 0 10*3/uL (ref 0.0–0.5)
Eosinophils Relative: 0 %
HCT: 48.1 % (ref 39.0–52.0)
Hemoglobin: 17.2 g/dL — ABNORMAL HIGH (ref 13.0–17.0)
Immature Granulocytes: 0 %
Lymphocytes Relative: 17 %
Lymphs Abs: 1.2 10*3/uL (ref 0.7–4.0)
MCH: 31.6 pg (ref 26.0–34.0)
MCHC: 35.8 g/dL (ref 30.0–36.0)
MCV: 88.3 fL (ref 80.0–100.0)
Monocytes Absolute: 0.5 10*3/uL (ref 0.1–1.0)
Monocytes Relative: 7 %
Neutro Abs: 5.5 10*3/uL (ref 1.7–7.7)
Neutrophils Relative %: 76 %
Platelets: 236 10*3/uL (ref 150–400)
RBC: 5.45 MIL/uL (ref 4.22–5.81)
RDW: 12.6 % (ref 11.5–15.5)
WBC: 7.4 10*3/uL (ref 4.0–10.5)
nRBC: 0 % (ref 0.0–0.2)

## 2022-12-15 LAB — GC/CHLAMYDIA PROBE AMP (~~LOC~~) NOT AT ARMC
Chlamydia: NEGATIVE
Comment: NEGATIVE
Comment: NORMAL
Neisseria Gonorrhea: NEGATIVE

## 2022-12-15 LAB — HIV ANTIBODY (ROUTINE TESTING W REFLEX): HIV Screen 4th Generation wRfx: NONREACTIVE

## 2022-12-15 LAB — PSA: Prostatic Specific Antigen: 0.74 ng/mL (ref 0.00–4.00)

## 2022-12-15 MED ORDER — CIPROFLOXACIN HCL 500 MG PO TABS: 500.0000 mg | ORAL_TABLET | Freq: Two times a day (BID) | ORAL | 0 refills | Status: AC

## 2022-12-15 MED ORDER — CLOTRIMAZOLE 1 % EX CREA: 1.0000 | TOPICAL_CREAM | Freq: Two times a day (BID) | CUTANEOUS | 0 refills | Status: DC

## 2022-12-15 NOTE — ED Notes (Signed)
Translator used during DC instrucrtions

## 2022-12-15 NOTE — ED Triage Notes (Signed)
Psychologist, sport and exercise used for clinical intake.    Pt states he took 2 doses of doxy and now has a rash on his face. He states he hasn't taken any meds for the rash.   Pt state he would like more STI labs done and find out why he has the rash. He states he does have burling in his penis still. He states he hasn't had any sexual activity in over month.

## 2022-12-15 NOTE — ED Provider Notes (Signed)
[place]    CSN: [id number] Arrival date & time: 12/15/22  0915      History   Chief Complaint Chief Complaint  Patient presents with   Rash    HPI Barry Zamora is a 47 y.o. male.   47 year old male presents today due to concerns of a possible rash to his face.  He states he started taking doxycycline on 12/13/22 secondary to concerns of urethritis.  Patient states he feels like his left eyebrow in the corners of his mouth has a swollen rash, likely secondary to the doxy. He has never taken this abx before.  He denies any systemic symptoms of allergies.  He was seen on 3/9 and again on 3/15 secondary to concerns of a burning on urination.  Patient denies any swelling in his testicles or scrotum.  Additionally he denies any penile discharge. No penile lesions. States the tip of his penis itches.  He was given an injection of ceftriaxone while in the emergency room, patient states this seems to have brought down the discomfort slightly, but it never went away.  He has now had this burning with urination for the past 2+ weeks, with no response to any treatments offered.  He has had urine assessments which were negative for UTIs.  Patient states he has not had sex since the beginning of February and denies any concern for recent STI.  He had a GC/ chlamydia swab obtained on 3/9 which was negative, and a repeat test on 3/15, still pending. He does have hx of HTN, stopped taking his norvasc.    Rash   Past Medical History:  Diagnosis Date   Gastritis     Patient Active Problem List   Diagnosis Date Noted   Gastritis 07/19/2022    History reviewed. No pertinent surgical history.     Home Medications    Prior to Admission medications   Medication Sig Start Date End Date Taking? Authorizing Provider  ciprofloxacin (CIPRO) 500 MG tablet Take 1 tablet (500 mg total) by mouth every 12 (twelve) hours for 14 days. 12/15/22 12/29/22 Yes Macyn Remmert L, PA   clotrimazole (LOTRIMIN) 1 % cream Apply 1 Application topically 2 (two) times daily. 12/15/22  Yes Javyon Fontan, Sherren Kerns, PA    Family History History reviewed. No pertinent family history.  Social History Social History   Tobacco Use   Smoking status: Never   Smokeless tobacco: Never  Vaping Use   Vaping Use: Never used  Substance Use Topics   Alcohol use: Yes    Comment: 2-3 beers after work   Drug use: Not Currently     Allergies   Patient has no known allergies.   Review of Systems Review of Systems  Skin:  Positive for rash.  As per HPI   Physical Exam Triage Vital Signs ED Triage Vitals  Enc Vitals Group     BP 12/15/22 1014 (!) 175/81     Pulse Rate 12/15/22 1014 65     Resp 12/15/22 1014 18     Temp 12/15/22 1014 98.4 F (36.9 C)     Temp Source 12/15/22 1014 Oral     SpO2 12/15/22 1014 98 %     Weight --      Height --      Head Circumference --      Peak Flow --      Pain Score 12/15/22 1011 0     Pain Loc --      Pain Edu? --  Excl. in GC? --    No data found.  Updated Vital Signs BP (!) 175/81 (BP Location: Right Arm)   Pulse 65   Temp 98.4 F (36.9 C) (Oral)   Resp 18   SpO2 98%   Visual Acuity Right Eye Distance:   Left Eye Distance:   Bilateral Distance:    Right Eye Near:   Left Eye Near:    Bilateral Near:     Physical Exam Vitals and nursing note reviewed. Exam conducted with a chaperone present.  Constitutional:      General: He is not in acute distress.    Appearance: Normal appearance. He is normal weight. He is not ill-appearing, toxic-appearing or diaphoretic.  HENT:     Head: Normocephalic and atraumatic.     Right Ear: External ear normal.     Left Ear: External ear normal.     Mouth/Throat:     Mouth: Mucous membranes are moist.     Pharynx: Oropharynx is clear. No oropharyngeal exudate.  Eyes:     General:        Right eye: No discharge.        Left eye: No discharge.     Extraocular Movements:  Extraocular movements intact.     Conjunctiva/sclera: Conjunctivae normal.     Pupils: Pupils are equal, round, and reactive to light.  Cardiovascular:     Rate and Rhythm: Normal rate.  Pulmonary:     Effort: Pulmonary effort is normal. No respiratory distress.  Abdominal:     General: Abdomen is flat. There is no distension.     Tenderness: There is no abdominal tenderness.  Genitourinary:    Penis: Circumcised. Erythema (minimal erythema to glans) present. No phimosis, discharge or lesions.      Testes:        Right: Mass, tenderness, swelling or testicular hydrocele not present.        Left: Mass, tenderness, swelling or testicular hydrocele not present.     Epididymis:     Right: Normal.     Left: Normal.     Comments: R testicle high scrotal, non-tender L testicle normal location, non-tender  Prostate exam deferred Musculoskeletal:     Cervical back: Normal range of motion.  Lymphadenopathy:     Lower Body: No right inguinal adenopathy. No left inguinal adenopathy.  Neurological:     Mental Status: He is alert.      UC Treatments / Results  Labs (all labs ordered are listed, but only abnormal results are displayed) Labs Reviewed  CBC WITH DIFFERENTIAL/PLATELET  COMPREHENSIVE METABOLIC PANEL  RPR  HIV ANTIBODY (ROUTINE TESTING W REFLEX)  PSA    EKG   Radiology No results found.  Procedures Procedures (including critical care time)  Medications Ordered in UC Medications - No data to display  Initial Impression / Assessment and Plan / UC Course  I have reviewed the triage vital signs and the nursing notes.  Pertinent labs & imaging results that were available during my care of the patient were reviewed by me and considered in my medical decision making (see chart for details).     Urticaria - MINIMAL hive to L eyebrow, unable to appreciate rash at corners of mouth.  As this started a day after taking Doxy, we will DC this medication.  Patient  encouraged to take p.o. Benadryl every 6 hours as needed. Balanitis -patient's symptoms are external with mild erythema noted.  No history of DM noted, but will obtain labs  to assess for this.  Topical clotrimazole twice daily recommended.  It appears that trichomonas testing was never obtained, should symptoms persist, this may be recommended. Dysuria -GC chlamydia probe from 3 9 was negative.  Urine culture negative.  Question if this is developing prostatitis.  Will recommend patient stop his doxycycline and switch to ciprofloxacin.  PSA lab testing obtained. Screen for STI -patient is specifically requesting HIV and RPR testing be obtained. Hypertension -patient used to be on amlodipine, he has stopped taking this.  Denies symptoms of hypertensive urgency in office.  Recommended patient follow-up with his PCP for further evaluation and management of his hypertension.   Final Clinical Impressions(s) / UC Diagnoses   Final diagnoses:  Urticaria  Balanitis  Dysuria  Routine screening for STI (sexually transmitted infection)  Hypertension, unspecified type     Discharge Instructions      Please stop the doxycycline. We will switch to ciprofloxacin. Take this twice daily.  Drink plenty of water while on this antibiotic. We drew labs today to determine any potential additional causes of your symptoms. Please use the topical cream to the tip of your penis twice daily for the next 7 to 10 days. You may take over-the-counter Benadryl, 25 mg 4 times daily to help with the rash on your face.    Por favor suspenda la doxiciclina. Empiece a tomar cipro. Este es un antibitico que debe tomarse dos veces al da. Beber abundante agua. Realizamos anlisis de laboratorio para determinar cualquier posible causa adicional de sus sntomas. Utilice la crema tpica en la punta de su pene dos veces diario durante los prximos 7 a 10 das. Puede tomar Benadryl, de venta libre, 25 mg cuatro veces al da  para ayudar con el sarpullido en la cara.     ED Prescriptions     Medication Sig Dispense Auth. Provider   ciprofloxacin (CIPRO) 500 MG tablet Take 1 tablet (500 mg total) by mouth every 12 (twelve) hours for 14 days. 28 tablet Sharief Wainwright L, PA   clotrimazole (LOTRIMIN) 1 % cream Apply 1 Application topically 2 (two) times daily. 30 g Lillis Nuttle L, [place]      PDMP not reviewed this encounter.   Chaney Malling, [place] 12/15/22 1150

## 2022-12-15 NOTE — Discharge Instructions (Addendum)
Please stop the doxycycline. We will switch to ciprofloxacin. Take this twice daily.  Drink plenty of water while on this antibiotic. We drew labs today to determine any potential additional causes of your symptoms. Please use the topical cream to the tip of your penis twice daily for the next 7 to 10 days. You may take over-the-counter Benadryl, 25 mg 4 times daily to help with the rash on your face.    Por favor suspenda la doxiciclina. Empiece a tomar cipro. Este es un antibitico que debe tomarse dos veces al da. Beber abundante agua. Realizamos anlisis de laboratorio para determinar cualquier posible causa adicional de sus sntomas. Utilice la crema tpica en la punta de su pene dos veces diario durante los prximos 7 a 10 das. Puede tomar Benadryl, de venta libre, 25 mg cuatro veces al da para ayudar con el sarpullido en la cara.

## 2022-12-16 LAB — RPR: RPR Ser Ql: NONREACTIVE

## 2022-12-20 ENCOUNTER — Ambulatory Visit (HOSPITAL_COMMUNITY)
Admission: EM | Admit: 2022-12-20 | Discharge: 2022-12-20 | Disposition: A | Discharge status: Home / Self Care | Payer: Self-pay | Attending: Emergency Medicine | Admitting: Emergency Medicine

## 2022-12-20 ENCOUNTER — Encounter (HOSPITAL_COMMUNITY): Payer: Self-pay

## 2022-12-20 DIAGNOSIS — R300 Dysuria: Secondary | ICD-10-CM | POA: Insufficient documentation

## 2022-12-20 LAB — POCT URINALYSIS DIPSTICK, ED / UC
Bilirubin Urine: NEGATIVE
Glucose, UA: NEGATIVE mg/dL
Ketones, ur: NEGATIVE mg/dL
Leukocytes,Ua: NEGATIVE
Nitrite: NEGATIVE
Protein, ur: NEGATIVE mg/dL
Specific Gravity, Urine: 1.01 (ref 1.005–1.030)
Urobilinogen, UA: 0.2 mg/dL (ref 0.0–1.0)
pH: 6.5 (ref 5.0–8.0)

## 2022-12-20 MED ORDER — CLOTRIMAZOLE 1 % EX CREA: 1.0000 | TOPICAL_CREAM | Freq: Two times a day (BID) | CUTANEOUS | 0 refills | Status: DC

## 2022-12-20 NOTE — ED Provider Notes (Signed)
[facility]    CSN: [id number] Arrival date & time: 12/20/22  1130      History   Chief Complaint Chief Complaint  Patient presents with   Follow-up    HPI Barry Zamora is a 47 y.o. male.  Medical interpretor used for this encounter Here with dysuria at end of urination.  Seen 3 times this month for similar Urine samples have been negative. Had negative G/C swab. He did not have trichomonas testing.   At last visit, prescribed clotrimazole BID for presumed balanitis. This has helped burning sensation. No new or worsening symptoms. No fever, flank pain, hematuria, trouble with stream, penile discharge, pain or swelling of testicles.  He is wondering why no one called him about his last visit lab work. Chart review with unremarkable HIV, RPR, PSA, CBC, CMP. We discussed the clinic only calls patients with a positive result. His mychart activation is pending.   He was actually treated with Duricef on 3/9 in the ED. Returned on 3/15 and had rocephin injection, doxycycline. Seen here on 3/18 and switched to cipro.  Past Medical History:  Diagnosis Date   Gastritis     Patient Active Problem List   Diagnosis Date Noted   Gastritis 07/19/2022    History reviewed. No pertinent surgical history.     Home Medications    Prior to Admission medications   Medication Sig Start Date End Date Taking? Authorizing Provider  ciprofloxacin (CIPRO) 500 MG tablet Take 1 tablet (500 mg total) by mouth every 12 (twelve) hours for 14 days. 12/15/22 12/29/22 Yes Crain, Whitney L, PA  clotrimazole (LOTRIMIN) 1 % cream Apply 1 Application topically 2 (two) times daily. 12/20/22   Yuniel Blaney, Vernice Jefferson    Family History History reviewed. No pertinent family history.  Social History Social History   Tobacco Use   Smoking status: Never   Smokeless tobacco: Never  Vaping Use   Vaping Use: Never used  Substance Use Topics   Alcohol use: Yes    Comment: 2-3  beers after work   Drug use: Not Currently     Allergies   Patient has no known allergies.   Review of Systems Review of Systems As per HPI  Physical Exam Triage Vital Signs ED Triage Vitals  Enc Vitals Group     BP 12/20/22 1304 136/84     Pulse Rate 12/20/22 1304 64     Resp 12/20/22 1304 16     Temp 12/20/22 1304 98 F (36.7 C)     Temp Source 12/20/22 1304 Oral     SpO2 12/20/22 1304 98 %     Weight --      Height --      Head Circumference --      Peak Flow --      Pain Score 12/20/22 1301 7     Pain Loc --      Pain Edu? --      Excl. in [place]? --    No data found.  Updated Vital Signs BP 136/84 (BP Location: Left Arm)   Pulse 64   Temp 98 F (36.7 C) (Oral)   Resp 16   SpO2 98%   Physical Exam Vitals and nursing note reviewed. Exam conducted with a chaperone present [facility] CMA).  Constitutional:      General: He is not in acute distress. HENT:     Mouth/Throat:     Pharynx: Oropharynx is clear.  Cardiovascular:     Rate and  Rhythm: Normal rate and regular rhythm.  Pulmonary:     Effort: Pulmonary effort is normal.  Abdominal:     Palpations: There is no mass.     Tenderness: There is no abdominal tenderness. There is no guarding or rebound.  Genitourinary:    Penis: Normal and circumcised. No erythema, tenderness, discharge, swelling or lesions.      Testes: Normal.     Comments: Normal exam. No discharge, bleeding, rash, irritation, erythema, lesions. Skin:    General: Skin is warm and dry.  Neurological:     Mental Status: He is alert and oriented to person, place, and time.     UC Treatments / Results  Labs (all labs ordered are listed, but only abnormal results are displayed) Labs Reviewed  POCT URINALYSIS DIPSTICK, ED / UC - Abnormal; Notable for the following components:      Result Value   Hgb urine dipstick TRACE (*)    All other components within normal limits  URINE CULTURE  CYTOLOGY, (ORAL, ANAL, URETHRAL) ANCILLARY ONLY     EKG  Radiology No results found.  Procedures Procedures (including critical care time)  Medications Ordered in UC Medications - No data to display  Initial Impression / Assessment and Plan / UC Course  I have reviewed the triage vital signs and the nursing notes.  Pertinent labs & imaging results that were available during my care of the patient were reviewed by me and considered in my medical decision making (see chart for details).  Urine today with trace hgb. Will culture.  Cytology swab for trichomonas is pending. Long discussion with patient about his previous negative results, further testing, further treatments. He is asking for antibiotics again today. Discussed this is not warranted and he's already had 4 different ones over the month, with negative testing. We will wait for the trich swab and urine culture and notify him if any positive. Advised him to continue clotrimazole BID since this has helped.  Discussed with his ongoing symptoms, he needs to be evaluated by specialist. Provided with two urology clinics for follow up. He will call first thing Monday.   E/M level 4 -- 30 minutes spent face to face with patient discussing concerns   Final Clinical Impressions(s) / UC Diagnoses   Final diagnoses:  Dysuria     Discharge Instructions      Use cream 2x daily i am testing for trichomonas and urine. if these labs are negative, we will NOT call you. we will only call you with a positive result.  You need to see the specialist. Call them first thing Monday morning  Usar la cream dos veces al dia Estoy realizando pruebas de tricomonas y [place]. Si estos anlisis son negativos, NO lo llamaremos. Slo le llamaremos con un resultado positivo. Si los sntomas persisten, es necesario que un especialista lo evale. Llame a la clnica de urologa para programar una cita.    ED Prescriptions     Medication Sig Dispense Auth. Provider   clotrimazole (LOTRIMIN) 1 %  cream Apply 1 Application topically 2 (two) times daily. 30 g Loralie Malta, Wells Guiles, PA-C      PDMP not reviewed this encounter.   Kymberli Wiegand, Wells Guiles, [place] 12/20/22 1622

## 2022-12-20 NOTE — ED Triage Notes (Signed)
Pt is here for follow up penile pain . Pt is also wanting to know about lab results. Pt states he continues with burning when urinating . Pt requesting a antibiotic and is very concerned about what is going on with his penis.

## 2022-12-20 NOTE — Discharge Instructions (Addendum)
Use cream 2x daily i am testing for trichomonas and urine. if these labs are negative, we will NOT call you. we will only call you with a positive result.  You need to see the specialist. Call them first thing Monday morning  Usar la cream dos veces al dia Estoy realizando pruebas de tricomonas y [place]. Si estos anlisis son negativos, NO lo llamaremos. Slo le llamaremos con un resultado positivo. Si los sntomas persisten, es necesario que un especialista lo evale. Llame a la clnica de urologa para programar una cita.

## 2022-12-21 LAB — URINE CULTURE: Culture: NO GROWTH

## 2022-12-22 LAB — CYTOLOGY, (ORAL, ANAL, URETHRAL) ANCILLARY ONLY
Comment: NEGATIVE
Trichomonas: NEGATIVE

## 2022-12-28 ENCOUNTER — Emergency Department (HOSPITAL_COMMUNITY)
Admission: EM | Admit: 2022-12-28 | Discharge: 2022-12-28 | Disposition: A | Discharge status: Home / Self Care | Payer: Self-pay | Attending: Emergency Medicine | Admitting: Emergency Medicine

## 2022-12-28 ENCOUNTER — Encounter (HOSPITAL_COMMUNITY): Payer: Self-pay | Admitting: *Deleted

## 2022-12-28 ENCOUNTER — Other Ambulatory Visit: Payer: Self-pay

## 2022-12-28 DIAGNOSIS — R300 Dysuria: Secondary | ICD-10-CM | POA: Insufficient documentation

## 2022-12-28 LAB — URINALYSIS, ROUTINE W REFLEX MICROSCOPIC
Bacteria, UA: NONE SEEN
Bilirubin Urine: NEGATIVE
Glucose, UA: NEGATIVE mg/dL
Ketones, ur: 5 mg/dL — AB
Leukocytes,Ua: NEGATIVE
Nitrite: NEGATIVE
Protein, ur: 30 mg/dL — AB
Specific Gravity, Urine: 1.021 (ref 1.005–1.030)
pH: 5 (ref 5.0–8.0)

## 2022-12-28 NOTE — Discharge Instructions (Signed)
Evaluation for your painful urination was overall reassuring.  Your testicular and penile exam were relatively normal.  Advised that you follow-up with your PCP if your symptoms persist.  No need to continue antibiotic treatment.

## 2022-12-28 NOTE — ED Notes (Signed)
Discharge instruction interpreted by Juliann Mule, Zacarias Pontes Spanish Interpreter.

## 2022-12-28 NOTE — ED Provider Notes (Signed)
[facility] Provider Note   CSN: [id number] Arrival date & time: 12/28/22  [id number]     History  Chief Complaint  Patient presents with   SEXUALLY TRANSMITTED DISEASE   HPI Barry Zamora is a 47 y.o. male  presenting for STI.  States he was seen here 2 weeks ago for STI.  Started on doxycycline and given Rocephin time to treat empirically.  States he still having burning urination.  Concerned that he may have a UTI.  Also states that both knees have hurt since he has been taking the doxycycline. Also mention that he had a couple bumps on his cheeks that he is concerned might be herpetic rashes.  Patient is requesting another urinalysis to determine whether or not he needs antibiotics and wants his "private parts" to be examined to make sure "everything is okay".  HPI     Home Medications Prior to Admission medications   Medication Sig Start Date End Date Taking? Authorizing Provider  ciprofloxacin (CIPRO) 500 MG tablet Take 1 tablet (500 mg total) by mouth every 12 (twelve) hours for 14 days. 12/15/22 12/29/22  Crain, Loree Fee L, PA  clotrimazole (LOTRIMIN) 1 % cream Apply 1 Application topically 2 (two) times daily. 12/20/22   Rising, Wells Guiles, PA-C      Allergies    Patient has no known allergies.    Review of Systems   Review of Systems  Genitourinary:  Positive for dysuria.    Physical Exam   Vitals:   12/28/22 1103  BP: (!) 146/87  Pulse: 65  Resp: 16  Temp: 98.7 F (37.1 C)  SpO2: 99%    CONSTITUTIONAL:  well-appearing, NAD NEURO:  Alert and oriented x 3, CN 3-12 grossly intact EYES:  eyes equal and reactive ENT/NECK:  Supple, no stridor  CARDIO: appears well-perfused  PULM:  No respiratory distress GI/GU: No lesions or rashes noted on the penis and scrotum.  No discharge noted at the meatus of the penis.  MSK/SPINE:  No gross deformities, no edema, moves all extremities  SKIN:  no rash,  atraumatic   *Additional and/or pertinent findings included in MDM below    ED Results / Procedures / Treatments   Labs (all labs ordered are listed, but only abnormal results are displayed) Labs Reviewed  URINALYSIS, ROUTINE W REFLEX MICROSCOPIC - Abnormal; Notable for the following components:      Result Value   Hgb urine dipstick SMALL (*)    Ketones, ur 5 (*)    Protein, ur 30 (*)    All other components within normal limits    EKG None  Radiology No results found.  Procedures Procedures    Medications Ordered in ED Medications - No data to display  ED Course/ Medical Decision Making/ A&P                             Medical Decision Making Amount and/or Complexity of Data Reviewed Labs: ordered.   47 year old male who is well-appearing presenting for concern for STI.  Was recently evaluated for urethritis.  Treated with ceftriaxone and doxycycline.  Patient has been compliant to take his medication.  Review of his recent labs were negative for STI.  Clinically patient looks very well with no concerning findings on testicular or penile exam.  Urinalysis also without leukocytes nitrites and bacteria making active infection less likely.  Advised him to follow-up with his PCP.  Discussed  return precautions.        Final Clinical Impression(s) / ED Diagnoses Final diagnoses:  Dysuria    Rx / DC Orders ED Discharge Orders     None         Harriet Pho, PA-C 12/28/22 1407    Hayden Rasmussen, MD 12/28/22 1742

## 2022-12-28 NOTE — ED Triage Notes (Signed)
Interpretor used-Sean [id number], pt speaks Spanish. Pt had sexual intercourse and test was positive. Pt took Doxycycline and Cipro. Pt is requesting another test for follow up.  Pt states he is still having burning to head of his penis and with penile discharge. States HIV test was negative.  Pt began to have bilateral leg pain and weakness after starting medications.  Requesting something to help with the leg pain. Pt denies any SI but admits to feeling depressed. Pt requesting something for the rash to his lip as well, has tried OTC medication without relief.

## 2023-01-02 ENCOUNTER — Encounter: Payer: Self-pay | Admitting: Urology

## 2023-01-02 ENCOUNTER — Ambulatory Visit: Payer: Self-pay | Admitting: Urology

## 2023-01-02 VITALS — BP 154/83 | HR 59 | Ht 65.75 in | Wt 185.2 lb

## 2023-01-02 DIAGNOSIS — R300 Dysuria: Secondary | ICD-10-CM

## 2023-01-02 LAB — MICROSCOPIC EXAMINATION
Bacteria, UA: NONE SEEN
Cast Type: NONE SEEN
Casts: NONE SEEN /lpf
Crystal Type: NONE SEEN
Crystals: NONE SEEN
Epithelial Cells (non renal): NONE SEEN /hpf (ref 0–10)
Renal Epithel, UA: NONE SEEN /hpf
Trichomonas, UA: NONE SEEN
WBC, UA: NONE SEEN /hpf (ref 0–5)
Yeast, UA: NONE SEEN

## 2023-01-02 LAB — URINALYSIS, ROUTINE W REFLEX MICROSCOPIC
Bilirubin, UA: NEGATIVE
Glucose, UA: NEGATIVE
Ketones, UA: NEGATIVE
Leukocytes,UA: NEGATIVE
Nitrite, UA: NEGATIVE
Protein,UA: NEGATIVE
Specific Gravity, UA: 1.02 (ref 1.005–1.030)
Urobilinogen, Ur: 0.2 mg/dL (ref 0.2–1.0)
pH, UA: 5.5 (ref 5.0–7.5)

## 2023-01-02 LAB — BLADDER SCAN AMB NON-IMAGING

## 2023-01-02 MED ORDER — SULFAMETHOXAZOLE-TRIMETHOPRIM 800-160 MG PO TABS: 1.0000 | ORAL_TABLET | Freq: Two times a day (BID) | ORAL | 0 refills | Status: DC

## 2023-01-02 MED ORDER — ALFUZOSIN HCL ER 10 MG PO TB24: 10.0000 mg | ORAL_TABLET | Freq: Every day | ORAL | 11 refills | Status: DC

## 2023-01-02 MED ORDER — SULFAMETHOXAZOLE-TRIMETHOPRIM 800-160 MG PO TABS: 1.0000 | ORAL_TABLET | Freq: Two times a day (BID) | ORAL | 0 refills | Status: AC

## 2023-01-02 NOTE — Progress Notes (Signed)
Assessment: 1. Dysuria     Plan: I personally reviewed the patient's chart including provider notes, and lab results. No evidence of UTI today. No evidence of STI based on negative test results. Recommend treatment for possible prostatitis. Begin Bactrim DS BID x 20 days Alfuzosin 10 mg daily.  Rx sent. Return to office in 4 weeks   Chief Complaint:  Chief Complaint  Patient presents with   Dysuria    History of Present Illness:  Barry Zamora is a 47 y.o. male who is seen for evaluation of dysuria. He initially presented to the emergency room at [facility] on 12/06/2022 with symptoms of dysuria x 1 week.  GC and Chlamydia testing were negative.  He was treated with antibiotics.  He returned with ongoing symptoms on 12/12/2022.  He was changed to doxycycline.  He was subsequently changed to Cipro on 12/15/2022 due to a possible reaction to the doxycycline.  He has returned to urgent care twice on 3/23 and 3/31 with continued symptoms.  Repeat testing for GC and chlamydia were negative.  Trichomonas testing was negative.  RPR negative.  Urine culture negative.  Urinalysis have been unremarkable.  PSA 0.74.  He continues to report symptoms of pain with urination.  His pain is primarily at the end of his penis.  He is not having any urethral discharge.  No scrotal pain. No gross hematuria or flank pain.  He is not having any frequency or urgency.   Past Medical History:  Past Medical History:  Diagnosis Date   Gastritis     Past Surgical History:  No past surgical history on file.  Allergies:  No Known Allergies  Family History:  No family history on file.  Social History:  Social History   Tobacco Use   Smoking status: Never   Smokeless tobacco: Never  Vaping Use   Vaping Use: Never used  Substance Use Topics   Alcohol use: Yes    Comment: 2-3 beers after work   Drug use: Not Currently    Review of symptoms:  Constitutional:  Negative for  unexplained weight loss, night sweats, fever, chills ENT:  Negative for nose bleeds, sinus pain, painful swallowing CV:  Negative for chest pain, shortness of breath, exercise intolerance, palpitations, loss of consciousness Resp:  Negative for cough, wheezing, shortness of breath GI:  Negative for nausea, vomiting, diarrhea, bloody stools GU:  Positives noted in HPI; otherwise negative for gross hematuria, urinary incontinence Neuro:  Negative for seizures, poor balance, limb weakness, slurred speech Psych:  Negative for lack of energy, depression, anxiety Endocrine:  Negative for polydipsia, polyuria, symptoms of hypoglycemia (dizziness, hunger, sweating) Hematologic:  Negative for anemia, purpura, petechia, prolonged or excessive bleeding, use of anticoagulants  Allergic:  Negative for difficulty breathing or choking as a result of exposure to anything; no shellfish allergy; no allergic response (rash/itch) to materials, foods  Physical exam: BP (!) 154/83   Pulse (!) 59   Ht 5' 5.75" (1.67 m)   Wt 185 lb 3 oz (84 kg)   BMI 30.12 kg/m  GENERAL APPEARANCE:  Well appearing, well developed, well nourished, NAD HEENT: Atraumatic, Normocephalic, oropharynx clear. NECK: Supple without lymphadenopathy or thyromegaly. LUNGS: Clear to auscultation bilaterally. HEART: Regular Rate and Rhythm without murmurs, gallops, or rubs. ABDOMEN: Soft, non-tender, No Masses. EXTREMITIES: Moves all extremities well.  Without clubbing, cyanosis, or edema. NEUROLOGIC:  Alert and oriented x 3, normal gait, CN II-XII grossly intact.  MENTAL STATUS:  Appropriate. BACK:  Non-tender  to palpation.  No CVAT SKIN:  Warm, dry and intact.   GU: Penis:  circumcised; no erythema; no lesions noted Meatus: Normal Scrotum: normal, no masses Testis: normal without masses bilateral Epididymis: normal Prostate: 30 g, NT, no nodules Rectum: Normal tone,  no masses or tenderness  Results: U/A: 0-2 RBCs, no WBCs, no  bacteria  PVR = 60 ml

## 2023-01-02 NOTE — Addendum Note (Signed)
Addended by: Lizbeth Bark on: 01/02/2023 09:45 AM   Modules accepted: Orders

## 2023-01-12 ENCOUNTER — Other Ambulatory Visit: Payer: Self-pay | Admitting: Urology

## 2023-01-12 DIAGNOSIS — Z0000 Encounter for general adult medical examination without abnormal findings: Secondary | ICD-10-CM

## 2023-01-16 ENCOUNTER — Emergency Department (HOSPITAL_COMMUNITY)
Admission: EM | Admit: 2023-01-16 | Discharge: 2023-01-16 | Disposition: A | Discharge status: Home / Self Care | Payer: Self-pay | Attending: Emergency Medicine | Admitting: Emergency Medicine

## 2023-01-16 ENCOUNTER — Encounter (HOSPITAL_COMMUNITY): Payer: Self-pay

## 2023-01-16 ENCOUNTER — Other Ambulatory Visit: Payer: Self-pay

## 2023-01-16 DIAGNOSIS — Z711 Person with feared health complaint in whom no diagnosis is made: Secondary | ICD-10-CM | POA: Insufficient documentation

## 2023-01-16 HISTORY — DX: Depression, unspecified: F32.A

## 2023-01-16 NOTE — ED Notes (Signed)
Pt reports the clinic he went to earlier today did not have access to laboratory testing, so he came here to be evaluated.

## 2023-01-16 NOTE — ED Triage Notes (Signed)
Spanish Interpreter used in Triage. Difficult to understand the reason the Pt has come to [facility] for evaluation. Pt handed this RN some paperwork from a clinic where the Pt was seen earlier today and reports that he was told to come here for an HIV test. Pt tested negative last month for HIV but is requesting to be tested again. Pt reports he has a rash on his nose and upper lip and possible dental infection as well.

## 2023-01-16 NOTE — ED Notes (Signed)
Spoke with pt with help of [facility] Interpreter: Barry Zamora to gain more information as to what pt is being seen for. Pt states that he was seen here 2 weeks ago and dx with an STD (pt doesn't remember which one) and that he is still having urinary symptoms. Pt c/o R flank pain and burning with urination. Pt also states that the head of his penis is very red. Pt also states that he has a tooth infection. States he was seen at Urgent Care today and given an injection of Solumedrol. Pt wants his teeth checked. Pt also had an HIV test done and states that he is supposed to have a repeat test and wants this done tonight as well.

## 2023-01-16 NOTE — ED Provider Notes (Signed)
   [place] EMERGENCY DEPARTMENT AT [facility]  Provider Note  CSN: [id number] Arrival date & time: 01/16/23 2053  History Chief Complaint  Patient presents with   Labs Only   History via Spanish Interpreter  Asher Torpey is a 47 y.o. male with no significant PMH has has numerous ED and UC visits recently for dysuria. Was seen today in a community clinic for a rash and concerns for 'candiasis'. He was given antifungal Rx and given instructions to get labs done (including HIV, Hepatitis, A1C, etc). He decided to come to the ED for lab draw. He also reports a rash on his abdomen. He mentions something about a rash on his face that cleared up after getting solumedrol IM at the clinic.   Home Medications Prior to Admission medications   Medication Sig Start Date End Date Taking? Authorizing Provider  alfuzosin (UROXATRAL) 10 MG 24 hr tablet Take 1 tablet (10 mg total) by mouth daily. 01/02/23   Stoneking, Danford Bad., MD  clotrimazole (LOTRIMIN) 1 % cream Apply 1 Application topically 2 (two) times daily. 12/20/22   Rising, Lurena Joiner, PA-C  sulfamethoxazole-trimethoprim (BACTRIM DS) 800-160 MG tablet Take 1 tablet by mouth every 12 (twelve) hours for 21 days. 01/02/23 01/23/23  Stoneking, Danford Bad., MD     Allergies    Patient has no known allergies.   Review of Systems   Review of Systems Please see HPI for pertinent positives and negatives  Physical Exam BP (!) 146/77 (BP Location: Right Arm)   Pulse 95   Temp 98.8 F (37.1 C)   Resp 20   Ht  (1.727 m)   Wt 84 kg   SpO2 100%   BMI 28.16 kg/m   Physical Exam Vitals and nursing note reviewed.  HENT:     Head: Normocephalic.     Nose: Nose normal.  Eyes:     Extraocular Movements: Extraocular movements intact.  Pulmonary:     Effort: Pulmonary effort is normal.  Genitourinary:    Penis: Normal.   Musculoskeletal:        General: Normal range of motion.     Cervical back: Neck supple.   Skin:    Findings: No rash (on exposed skin).  Neurological:     Mental Status: He is alert and oriented to person, place, and time.  Psychiatric:        Mood and Affect: Mood normal.     ED Results / Procedures / Treatments   EKG None  Procedures Procedures  Medications Ordered in the ED Medications - No data to display  Initial Impression and Plan  Patient here primary for a lab draw. He was informed that none of the tests ordered by the community clinic are indicated for an ED visit. He was advised to follow up with the clinic for instructions on where to go for labs OR to [facility]. No emergent medical condition is identified at this visit.   ED Course       MDM Rules/Calculators/A&P Medical Decision Making Problems Addressed: Physically well but worried: self-limited or minor problem     Final Clinical Impression(s) / ED Diagnoses Final diagnoses:  Physically well but worried    Rx / DC Orders ED Discharge Orders     None        Pollyann Savoy, MD 01/16/23 2335

## 2023-01-16 NOTE — ED Notes (Signed)
Pt reports he has not had unprotected sex since his last visit but wants to be tested again for STD's and reports he wants to get help with his depression. Pt reports seeing a therapist today and does not have another appointment until May 3rd.

## 2023-02-03 ENCOUNTER — Ambulatory Visit: Payer: Self-pay | Admitting: Urology

## 2023-02-03 ENCOUNTER — Encounter: Payer: Self-pay | Admitting: Urology

## 2023-02-03 NOTE — Progress Notes (Deleted)
   Assessment: 1. Dysuria     Plan: Begin Bactrim DS BID x 20 days Alfuzosin 10 mg daily.  Rx sent. Return to office in 4 weeks  Chief Complaint:  No chief complaint on file.   History of Present Illness:  Barry Zamora is a 47 y.o. male who is seen for continued evaluation of dysuria. He initially presented to the emergency room at [facility] on 12/06/2022 with symptoms of dysuria x 1 week.  GC and Chlamydia testing were negative.  He was treated with antibiotics.  He returned with ongoing symptoms on 12/12/2022.  He was changed to doxycycline.  He was subsequently changed to Cipro on 12/15/2022 due to a possible reaction to the doxycycline.  He returned to urgent care twice on 3/23 and 3/31 with continued symptoms.  Repeat testing for GC and chlamydia were negative.  Trichomonas testing was negative.  RPR negative.  Urine culture negative.  Urinalysis have been unremarkable.  PSA 0.74.  At his initial visit in 4/24, he continued to report symptoms of pain with urination.  His pain was located at the end of his penis.  He was not having any urethral discharge.  No scrotal pain. No gross hematuria or flank pain.  He was not having any frequency or urgency. He was treated with Bactrim for 20 days and started on alfuzosin.  Portions of the above documentation were copied from a prior visit for review purposes only.   Past Medical History:  Past Medical History:  Diagnosis Date   Depression    Gastritis     Past Surgical History:  No past surgical history on file.  Allergies:  No Known Allergies  Family History:  No family history on file.  Social History:  Social History   Tobacco Use   Smoking status: Never   Smokeless tobacco: Never  Vaping Use   Vaping Use: Never used  Substance Use Topics   Alcohol use: Yes    Comment: 2-3 beers after work   Drug use: Not Currently    ROS: Constitutional:  Negative for fever, chills, weight loss CV: Negative for  chest pain, previous MI, hypertension Respiratory:  Negative for shortness of breath, wheezing, sleep apnea, frequent cough GI:  Negative for nausea, vomiting, bloody stool, GERD  Physical exam: There were no vitals taken for this visit. GENERAL APPEARANCE:  Well appearing, well developed, well nourished, NAD HEENT:  Atraumatic, normocephalic, oropharynx clear NECK:  Supple without lymphadenopathy or thyromegaly ABDOMEN:  Soft, non-tender, no masses EXTREMITIES:  Moves all extremities well, without clubbing, cyanosis, or edema NEUROLOGIC:  Alert and oriented x 3, normal gait, CN II-XII grossly intact MENTAL STATUS:  appropriate BACK:  Non-tender to palpation, No CVAT SKIN:  Warm, dry, and intact  Results: U/A:

## 2023-04-22 ENCOUNTER — Ambulatory Visit: Payer: Self-pay | Admitting: Urology

## 2023-04-30 ENCOUNTER — Ambulatory Visit: Payer: Self-pay | Admitting: Urology

## 2023-04-30 ENCOUNTER — Encounter: Payer: Self-pay | Admitting: Urology

## 2023-05-21 ENCOUNTER — Other Ambulatory Visit: Payer: Self-pay

## 2023-05-21 DIAGNOSIS — R300 Dysuria: Secondary | ICD-10-CM

## 2023-05-22 ENCOUNTER — Ambulatory Visit (INDEPENDENT_AMBULATORY_CARE_PROVIDER_SITE_OTHER): Payer: Self-pay | Admitting: Urology

## 2023-05-22 ENCOUNTER — Other Ambulatory Visit
Admission: RE | Admit: 2023-05-22 | Discharge: 2023-05-22 | Disposition: A | Discharge status: Home / Self Care | Payer: Self-pay | Attending: Urology | Admitting: Urology

## 2023-05-22 ENCOUNTER — Encounter: Payer: Self-pay | Admitting: Urology

## 2023-05-22 VITALS — BP 153/83 | HR 57 | Ht 65.75 in | Wt 162.8 lb

## 2023-05-22 DIAGNOSIS — R300 Dysuria: Secondary | ICD-10-CM

## 2023-05-22 DIAGNOSIS — N4889 Other specified disorders of penis: Secondary | ICD-10-CM

## 2023-05-22 LAB — URINALYSIS, COMPLETE (UACMP) WITH MICROSCOPIC
Bacteria, UA: NONE SEEN
Bilirubin Urine: NEGATIVE
Glucose, UA: NEGATIVE mg/dL
Hgb urine dipstick: NEGATIVE
Ketones, ur: NEGATIVE mg/dL
Leukocytes,Ua: NEGATIVE
Nitrite: NEGATIVE
Protein, ur: NEGATIVE mg/dL
Specific Gravity, Urine: 1.02 (ref 1.005–1.030)
Squamous Epithelial / HPF: NONE SEEN /HPF (ref 0–5)
WBC, UA: NONE SEEN WBC/hpf (ref 0–5)
pH: 5.5 (ref 5.0–8.0)

## 2023-05-22 NOTE — Progress Notes (Signed)
Barry Zamora,acting as a scribe for Barry Scotland, MD.,have documented all relevant documentation on the behalf of Barry Scotland, MD,as directed by  Barry Scotland, MD while in the presence of Barry Scotland, MD.  05/22/2023 11:02 AM   Barry Zamora October 17, 1975 [id number]  Referring provider: [name], NP [address] [place],  [place] [address]  Chief Complaint  Patient presents with   Establish Care    HPI: 47 year-old male who was self-referred for further evaluation of burning with urination.   He previously saw Dr. Pete Glatter back in April for the same issue. At that time, he had recently presented to [facility] in March and testing including GC and chlamydia were negative. He was treated with antibiotics, later changed to doxycycline, and then Cipro due to possible reaction. He saw urgent care multiple times. A laboratory test was negative. His PSA was low at 0.74. At the time of his evaluation with Dr. Pete Glatter, he had a normal GU exam. His PVR was low and his urinalysis was negative. He was treated with 20 days of Bactrim and 10 mg of Alfuzosin, and he was supposed to follow up.   Today, he reports that he started to have the burning sensation again about a month ago. He describes it as an itching and burning sensation inside his penis that occurs all the time, not just when urinating. He denies testicular pain.   He reports that he bought ceftriaxone online and had somebody inject him with it yesterday.   A spanish translator was used during the course of this appointment.   Results for orders placed or performed during the hospital encounter of 05/22/23  Urinalysis, Complete w Microscopic -  Result Value Ref Range   Color, Urine YELLOW YELLOW   APPearance CLEAR CLEAR   Specific Gravity, Urine 1.020 1.005 - 1.030   pH 5.5 5.0 - 8.0   Glucose, UA NEGATIVE NEGATIVE mg/dL   Hgb urine dipstick NEGATIVE NEGATIVE   Bilirubin Urine  NEGATIVE NEGATIVE   Ketones, ur NEGATIVE NEGATIVE mg/dL   Protein, ur NEGATIVE NEGATIVE mg/dL   Nitrite NEGATIVE NEGATIVE   Leukocytes,Ua NEGATIVE NEGATIVE   Squamous Epithelial / HPF NONE SEEN 0 - 5 /HPF   WBC, UA NONE SEEN 0 - 5 WBC/hpf   RBC / HPF 0-5 0 - 5 RBC/hpf   Bacteria, UA NONE SEEN NONE SEEN     PMH: Past Medical History:  Diagnosis Date   Depression    Gastritis     Home Medications:  Allergies as of 05/22/2023   No Known Allergies      Medication List        Accurate as of May 22, 2023 11:02 AM. If you have any questions, ask your nurse or doctor.          STOP taking these medications    alfuzosin 10 MG 24 hr tablet Commonly known as: UROXATRAL Stopped by: Barry Zamora   clotrimazole 1 % cream Commonly known as: LOTRIMIN Stopped by: Barry Zamora        Social History:  reports that he has never smoked. He has never used smokeless tobacco. He reports current alcohol use. He reports that he does not currently use drugs.   Physical Exam: BP (!) 153/83 (BP Location: Left Arm, Patient Position: Sitting, Cuff Size: Normal)   Pulse (!) 57   Ht 5' 5.75" (1.67 m)   Wt 162 lb 12.8 oz (73.8 kg)   BMI 26.48  kg/m   Constitutional:  Alert and oriented, No acute distress. HEENT:  AT, moist mucus membranes.  Trachea midline, no masses. Neurologic: Grossly intact, no focal deficits, moving all 4 extremities. Psychiatric: Normal mood and affect.  Results for orders placed or performed in visit on 01/02/23  Microscopic Examination   Urine  Result Value Ref Range   WBC, UA None seen 0 - 5 /hpf   RBC, Urine 0-2 0 - 2 /hpf   Epithelial Cells (non renal) None seen 0 - 10 /hpf   Renal Epithel, UA None seen None seen /hpf   Casts None seen None seen /lpf   Cast Type None seen N/A   Crystals None seen N/A   Crystal Type None seen N/A   Mucus, UA Present (A) Not Estab.   Bacteria, UA None seen None seen/Few   Yeast, UA None seen None seen    Trichomonas, UA None seen None seen  Urinalysis, Routine w reflex microscopic  Result Value Ref Range   Specific Gravity, UA 1.020 1.005 - 1.030   pH, UA 5.5 5.0 - 7.5   Color, UA Yellow Yellow   Appearance Ur Clear Clear   Leukocytes,UA Negative Negative   Protein,UA Negative Negative/Trace   Glucose, UA Negative Negative   Ketones, UA Negative Negative   RBC, UA Trace (A) Negative   Bilirubin, UA Negative Negative   Urobilinogen, Ur 0.2 0.2 - 1.0 mg/dL   Nitrite, UA Negative Negative   Microscopic Examination See below:   Bladder Scan (Post Void Residual) in office  Result Value Ref Range   Scan Result 60mL     Assessment & Plan:    1. Dysuria/penile pain - It is associated with no other urinary symptoms, happens with or without urination. - He has had extensive STI testing, all of which is negative. - It does not seem like there is a bacterial infection. - Suspicious for autoimmune urethritis versus an anatomic issue such as stricture - Plan for cystoscopy and discussed risks and benefits.  - Recommend trying Azo or Pyridium for burning sensation, with instructions not to use it for more than two consecutive days. - Advise against self-administering antibiotics purchased online due to potential harm.  Return in about 2 weeks (around 06/05/2023) for cystoscopy.  I have reviewed the above documentation for accuracy and completeness, and I agree with the above.   Barry Scotland, MD    [facility] [address] [place], [place] [address] [phone]

## 2023-05-22 NOTE — Patient Instructions (Addendum)
AZO/Pyridium  Cistoscopia Cystoscopy La cistoscopia es un procedimiento que se utiliza para ayudar a Education administrator y, a Occupational psychologist, tratar afecciones que afectan las vas urinarias inferiores. Las vas urinarias inferiores incluyen la vejiga y Engineer, mining. La uretra es el conducto por el que drena la orina de la vejiga. La cistoscopia se hace con un instrumento fino en forma de tubo con Neomia Dear luz y una cmara en el extremo (cistoscopio). El cistoscopio puede ser duro o flexible, segn el objetivo del procedimiento. El cistoscopio se inserta por la uretra e ingresa a la vejiga. La cistoscopia se puede recomendar en los siguientes casos: Infecciones de las vas urinarias que se repiten. Sangre en la orina (hematuria). Incapacidad para controlar la orina (incontinencia urinaria) o vejiga hiperactiva. Clulas inusuales que se encuentran en Lauris Poag de [place]. Una obstruccin en la uretra, como un clculo urinario. Dolor al Beatrix Shipper. Una anormalidad en la vejiga que se encuentra durante una pielografa intravenosa (PIV) o exploracin por tomografa computarizada (TC). La cistoscopia tambin puede realizarse para extraer Lauris Poag de tejido para examinarla con un microscopio (biopsia). Informe al mdico acerca de lo siguiente: Cualquier alergia que tenga. Todos los [employer] [place], incluidos vitaminas, hierbas, gotas oftlmicas, cremas y [address] de [address]. Problemas previos que usted o algn miembro de su familia hayan tenido con los anestsicos. Cualquier trastorno de la sangre que tenga. Cirugas a las que se haya sometido. Cualquier afeccin mdica que tenga. Si est embarazada o podra estarlo. Cules son los riesgos? En general, se trata de un procedimiento seguro. Sin embargo, pueden ocurrir complicaciones, por ejemplo: Infeccin. Sangrado. Reacciones alrgicas a los medicamentos. Daos a Systems developer u otros rganos. Qu ocurre antes del  procedimiento? Medicamentos Consulte al mdico sobre: Multimedia programmer o suspender los medicamentos que [place] habitualmente. Esto es muy importante si toma medicamentos para la diabetes o anticoagulantes. Tomar medicamentos como aspirina e ibuprofeno. Estos medicamentos pueden tener un efecto anticoagulante en la [place]. No tome estos medicamentos a menos que el mdico se lo indique. Usar medicamentos de venta libre, vitaminas, hierbas y suplementos. Estudios Pueden hacerle un examen o estudios, tales como: Radiografas de la vejiga, la uretra o los riones. Exploracin por tomografa computarizada (TC) del abdomen o la pelvis. Anlisis de orina para verificar si hay signos de infeccin. Instrucciones generales Siga las instrucciones del mdico respecto de las restricciones de comidas o bebidas. Pregntele al mdico qu medidas se tomarn para ayudar a prevenir una infeccin. Estas medidas pueden incluir las siguientes: Lavar la piel con un jabn antisptico. Recibir antibiticos. Haga que un adulto responsable lo lleve a su casa desde el hospital o la clnica. Qu ocurre durante el procedimiento?  Le administrarn uno o ms de los siguientes medicamentos: Un medicamento para ayudar a Lexicographer (sedante). Un medicamento para adormecer la zona (anestesia local). Se limpiar la zona que se encuentra alrededor de la abertura de Engineer, mining. El cistoscopio se introducir por la uretra e ingresar a la vejiga. Un lquido estril fluir por el cistoscopio y Probation officer vejiga. El lquido Designer, television/film set vejiga para que el mdico pueda examinar claramente las paredes de la vejiga. El mdico examinar la uretra y la vejiga. El mdico puede tomar una biopsia o extraer clculos. El cistoscopio se retirar y Set designer. El procedimiento puede variar segn el mdico y el hospital. Qu puedo esperar despus del procedimiento? Despus del procedimiento, es normal tener los siguientes sntomas: Algo de  dolor o molestias en el abdomen y Engineer, mining. Sntomas  urinarios. Estos incluyen los siguientes: Dolor o ardor leves al Geographical information systems officer. El dolor debe ceder unos minutos despus de Geographical information systems officer. Esto puede durar 1 semana. Una pequea cantidad de sangre en la [employer]. Sentir que Immunologist produce solo una pequea cantidad [place]. Siga estas instrucciones en su casa: Medicamentos Use los medicamentos de venta libre y los recetados solamente como se lo haya indicado el mdico. Si le recetaron un antibitico, tmelo como se lo haya indicado el mdico. No deje de tomar el antibitico aunque comience a sentirse mejor. Instrucciones generales Retome sus actividades normales segn lo indicado por el mdico. Pregntele al mdico qu actividades son seguras para usted. Si le administraron un sedante durante el procedimiento, puede afectarlo por varias horas. No conduzca ni opere maquinaria hasta que el mdico le indique que es seguro [place]. Observe si hay sangre en la orina. Si la cantidad de [employer] orina aumenta, comunquese con el mdico. Siga las instrucciones del mdico respecto de las restricciones de comidas o bebidas. Si se tom [place] de tejido para anlisis (biopsia) durante el procedimiento, depende de usted [employer] de la prueba. Consulte al mdico o pregunte en el departamento donde se realiza la prueba cundo estarn [employer]. Beber suficiente lquido como para Pharmacologist la orina de color amarillo plido. Cumpla con todas las visitas de seguimiento. Esto es importante. Comunquese con un mdico si: Tiene un dolor que empeora o que no mejora con los medicamentos, especialmente al Geographical information systems officer. Tiene problemas para orinar. Observa ms sangre en la orina. Solicite ayuda de inmediato si: Hay cogulos de [employer]. Siente dolor abdominal. Tiene fiebre o escalofros. No puede orinar. Resumen La cistoscopia es un procedimiento que se  [place] para ayudar a Education administrator y, a Occupational psychologist, tratar afecciones que afectan las vas urinarias inferiores. La cistoscopia se hace con un instrumento fino en forma de tubo con una luz y una cmara en el extremo. Despus del procedimiento, es comn tener algo de sensibilidad o dolor en el abdomen y Engineer, mining. Observe si hay sangre en la orina. Si la cantidad de [employer] orina aumenta, comunquese con el mdico. Si le recetaron un antibitico, tmelo como se lo haya indicado el mdico. No deje de tomar el antibitico aunque comience a sentirse mejor. Esta informacin no tiene Theme park manager el consejo del mdico. Asegrese de hacerle al mdico cualquier pregunta que tenga. Document Revised: 07/09/2020 Document Reviewed: 07/09/2020 Elsevier Patient Education  2024 [employer].

## 2023-05-29 ENCOUNTER — Ambulatory Visit: Payer: Self-pay | Admitting: Urology

## 2023-06-05 ENCOUNTER — Other Ambulatory Visit: Payer: Self-pay | Admitting: Urology

## 2023-06-19 ENCOUNTER — Ambulatory Visit (INDEPENDENT_AMBULATORY_CARE_PROVIDER_SITE_OTHER): Payer: Self-pay | Admitting: Urology

## 2023-06-19 VITALS — BP 155/89 | HR 64

## 2023-06-19 DIAGNOSIS — R300 Dysuria: Secondary | ICD-10-CM

## 2023-06-19 DIAGNOSIS — N4889 Other specified disorders of penis: Secondary | ICD-10-CM

## 2023-06-19 NOTE — Progress Notes (Signed)
   06/19/23  CC:  Chief Complaint  Patient presents with   Cysto    HPI: 47 yo M with chronic penile pain/ dysuria who presents today for cysto.  He does report today via translator he is improving.    UA today remains negative.   Blood pressure (!) 155/89, pulse 64. NED. A&Ox3.   No respiratory distress   Abd soft, NT, ND Normal phallus with bilateral descended testicles  Cystoscopy Procedure Note  Patient identification was confirmed, informed consent was obtained, and patient was prepped using Betadine solution.  Lidocaine jelly was administered per urethral meatus.     Pre-Procedure: - Inspection reveals a normal caliber ureteral meatus.  Procedure: The flexible cystoscope was introduced without difficulty - No urethral strictures/lesions are present. - Normal prostate  - Normal bladder neck - Bilateral ureteral orifices identified - Bladder mucosa  reveals no ulcers, tumors, or lesions - No bladder stones - No trabeculation  Retroflexion shows unremarkable   Post-Procedure: - Patient tolerated the procedure well  Assessment/ Plan:  1. Dysuria Improving  All testing negative, cysto also unremarkable today  Reassured.  F/u with worsens.    2. Penile pain As above    F/u prn  [name], MD

## 2023-10-19 IMAGING — DX DG CHEST 2V
2 series · 2 of 2 positions shown · non-contrast
Comparison: None.

CLINICAL DATA: Right-sided chest pain, chest numbness, history of
remote trauma

EXAM:
CHEST - 2 VIEW

[chest pa]
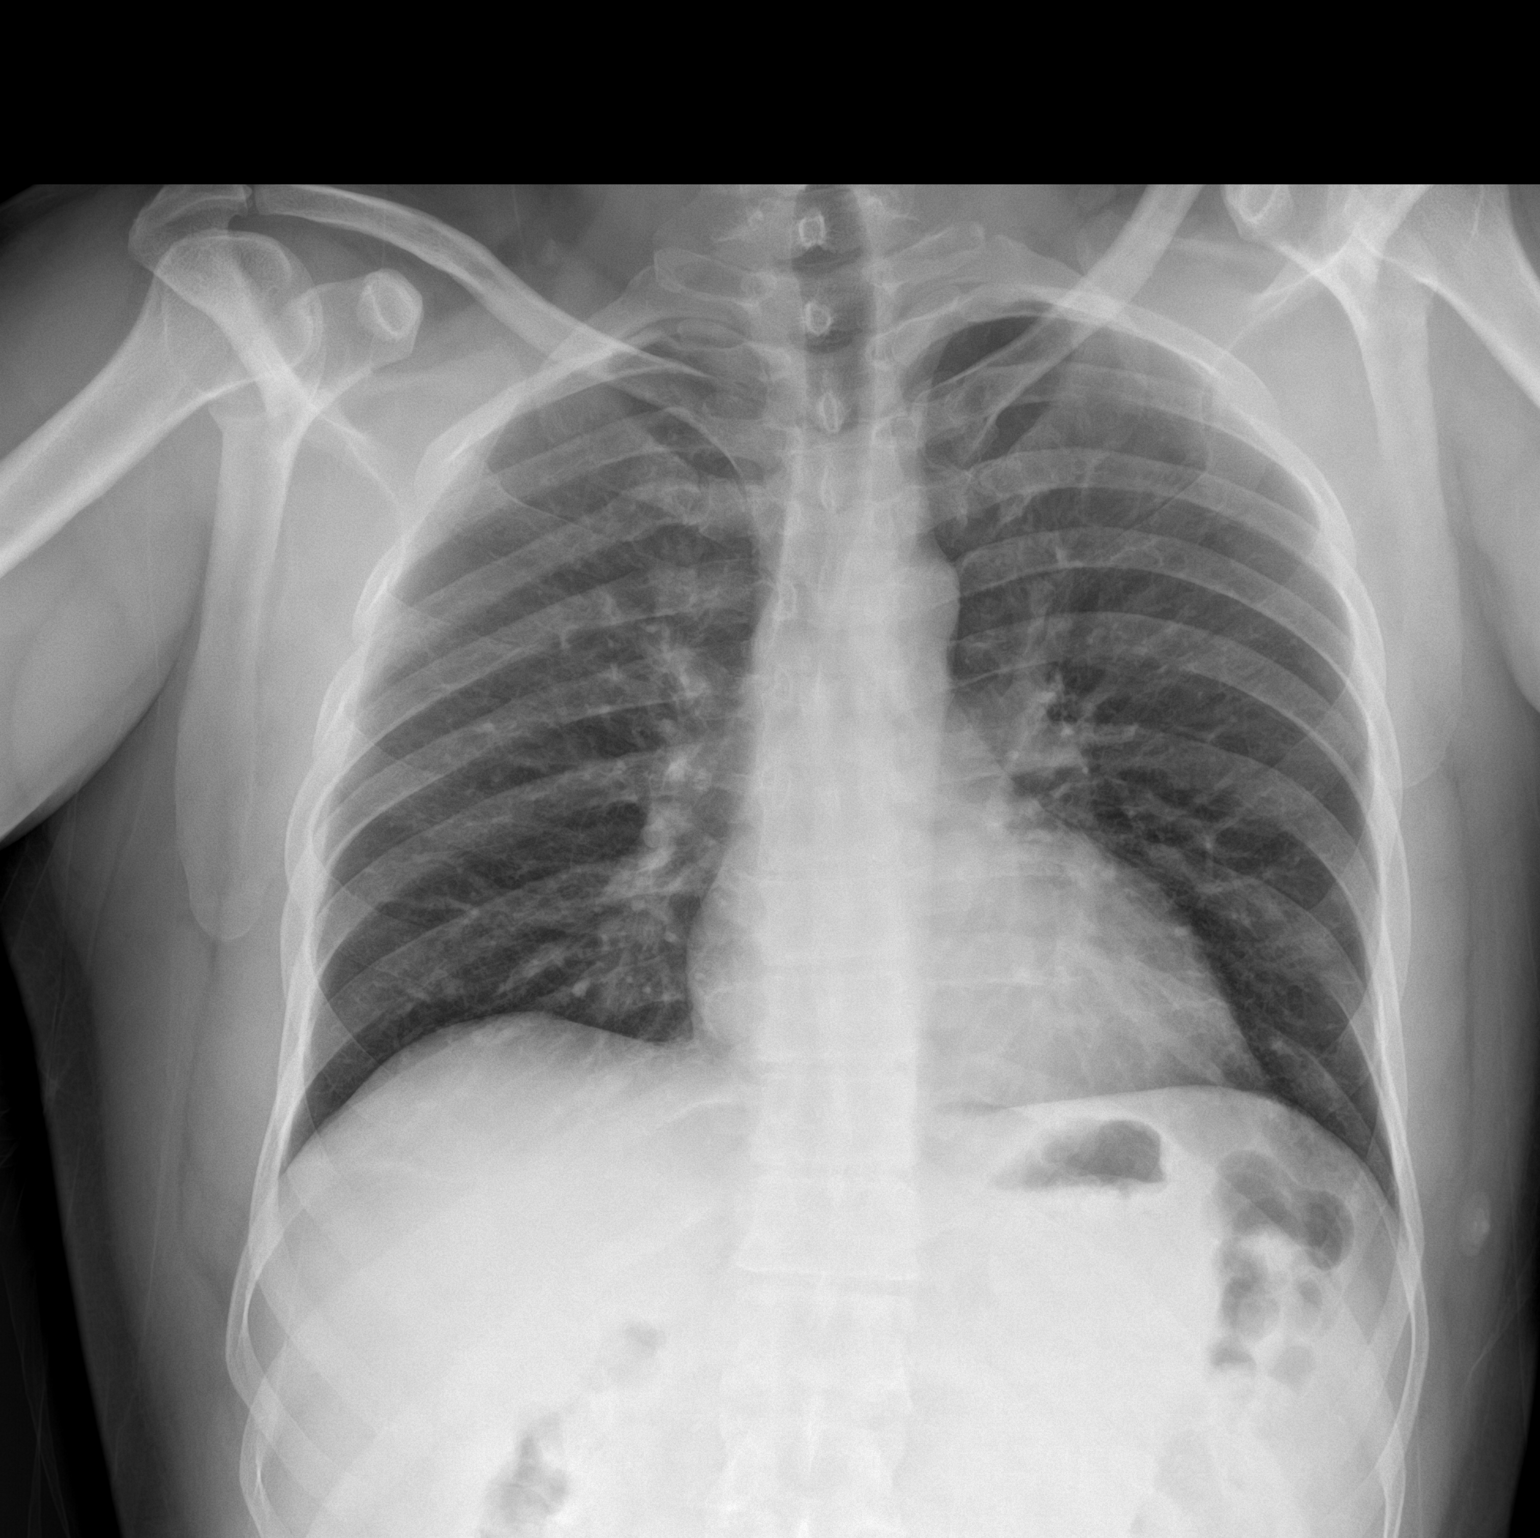

[chest lat]
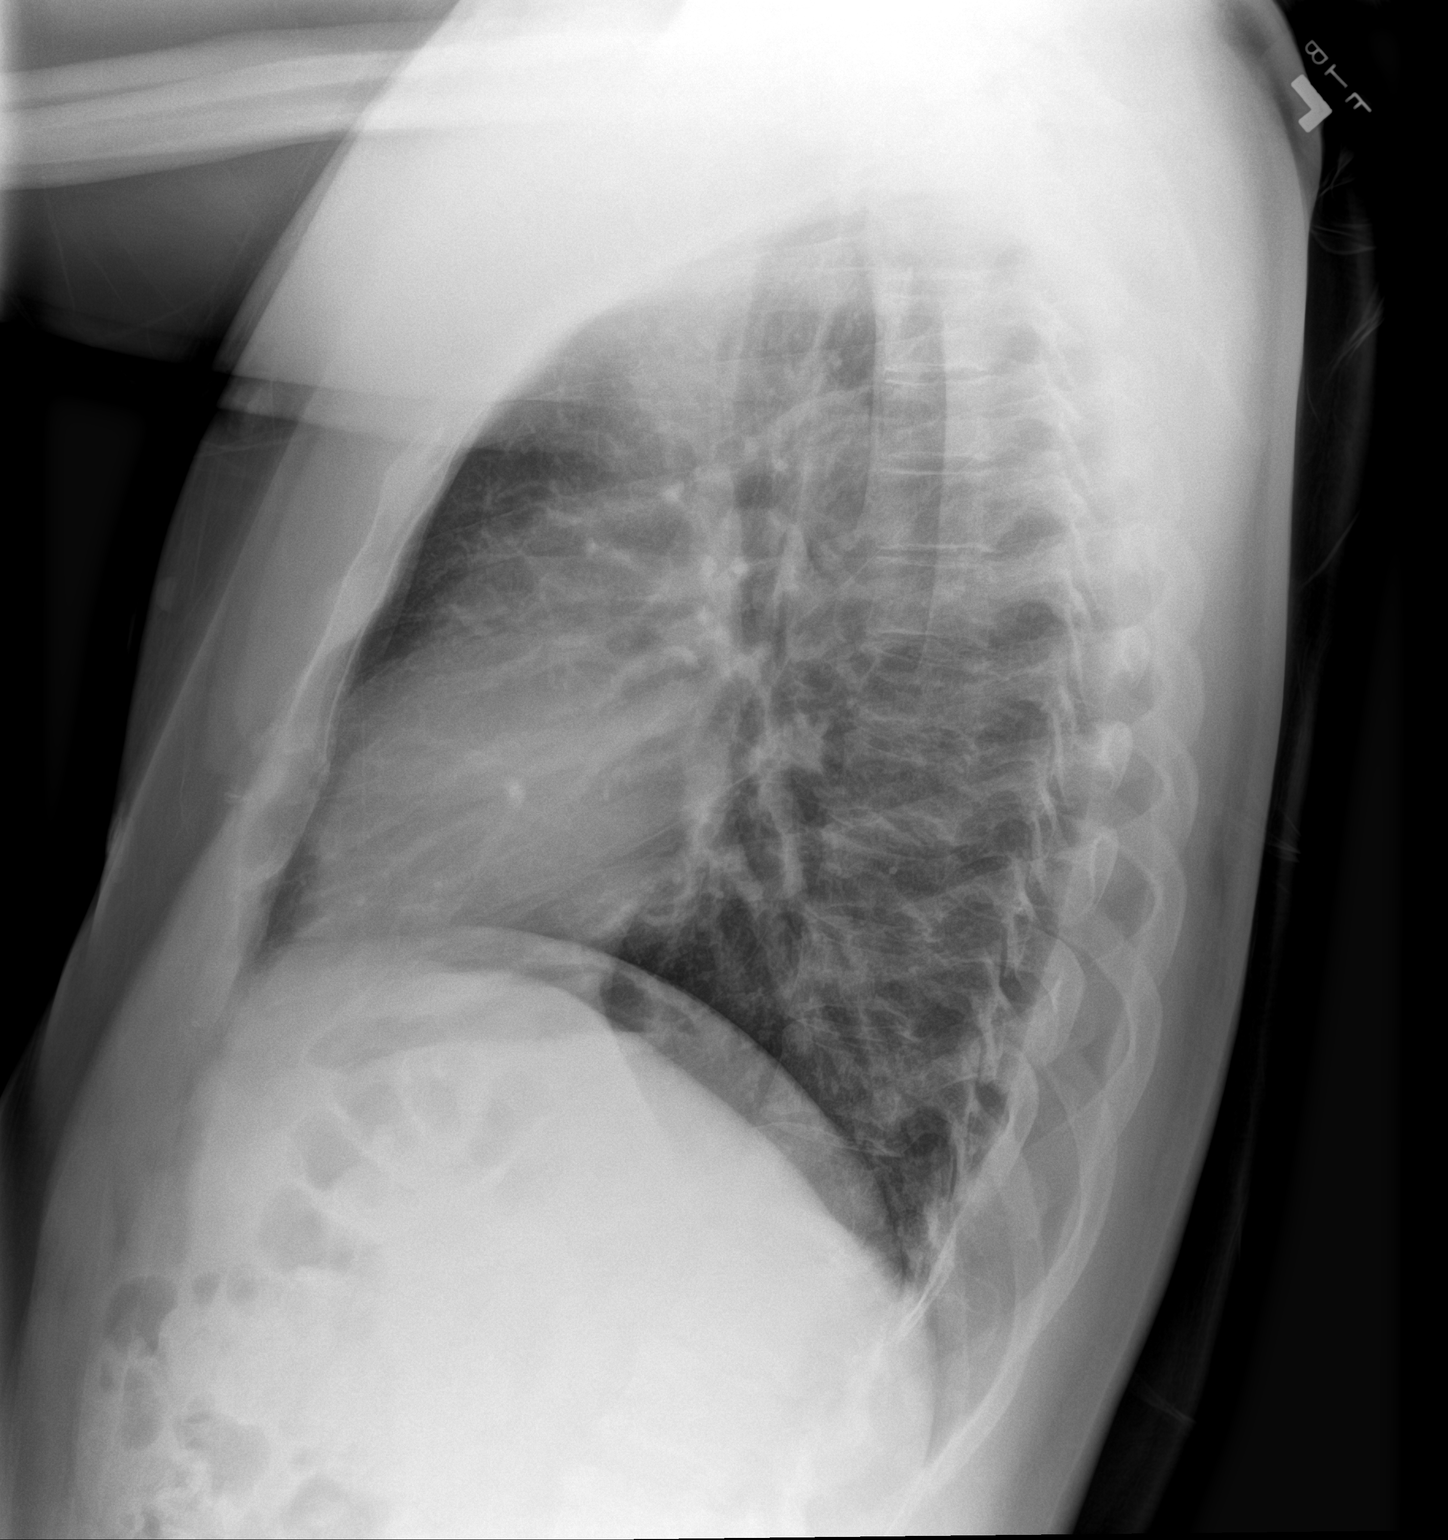

[2 of 2 positions shown; findings below may reference images not displayed]

FINDINGS: Frontal and lateral views of the chest demonstrate an unremarkable
cardiac silhouette. No acute airspace disease, effusion, or
pneumothorax. There are no acute bony abnormalities.
IMPRESSION: 1. No acute intrathoracic process.

## 2023-11-19 NOTE — Progress Notes (Signed)
 11/20/2023 12:46 AM   Barry Zamora Jan 13, 1976 [id number]  Referring provider: No referring provider defined for this encounter.  Urological history: 1. Urethritis -cysto (05/2023) - Zamora  Chief Complaint  Patient presents with   Other    atypical's, GC/chlamydia, patient is having pain in genital area; tip of penis, also has burning sensation   HPI: Barry Zamora is a 48 y.o. male who presents today for patient is having pain in genital area; tip of penis, also has burning sensation with interpreter, Barry Zamora.    Previous records reviewed.   He states he had unprotected sex and would like extensive STI testing.   For the last month, he has had suprapubic pain, bilateral testicular pain, a burning pain at the end of his penis with dysuria.  He denied any penile discharge or penile rashes, lesions or ulcers.    Patient denies any modifying or aggravating factors.  Patient denies any recent UTI's, gross hematuria or suprapubic/flank pain.  Patient denies any fevers, chills, nausea or vomiting.    UA yellow clear, specific gravity < 1.005, pH 6.0 and microscopy nothing.    PVR 58 mL   PMH: Past Medical History:  Diagnosis Date   Depression    Gastritis     Surgical History: No past surgical history on file.  Home Medications:  Allergies as of 11/20/2023   No Known Allergies      Medication List        Accurate as of November 20, 2023 11:59 PM. If you have any questions, ask your nurse or doctor.          azithromycin 250 MG tablet Commonly known as: Zithromax Take two tablets day one and one tablet days 2, 3, 4 and 5. Started by: Barry Zamora   doxycycline 100 MG capsule Commonly known as: VIBRAMYCIN Take 1 capsule (100 mg total) by mouth every 12 (twelve) hours. Started by: Barry Zamora        Allergies: No Known Allergies  Family History: No family history on file.  Social History:  reports that he has  never smoked. He has never used smokeless tobacco. He reports current alcohol use. He reports that he does not currently use drugs.  ROS: Pertinent ROS in HPI  Physical Exam: BP (!) 143/79   Pulse 65   Ht 5\' 5"  (1.651 m)   Wt 178 lb (80.7 kg)   BMI 29.62 kg/m   Constitutional:  Well nourished. Alert and oriented, No acute distress. HEENT: [place] AT, moist mucus membranes.  Trachea midline Cardiovascular: No clubbing, cyanosis, or edema. Respiratory: Normal respiratory effort, no increased work of breathing. GU: No CVA tenderness.  No bladder fullness or masses.  Patient with normal phallus.  Urethral meatus is patent.  No penile discharge. No penile lesions or rashes. Scrotum without lesions, cysts, rashes and/or edema.  Testicles are located scrotally bilaterally. No masses are appreciated in the testicles. Left and right epididymis are normal. Rectal: Patient with  normal sphincter tone. Anus and perineum without scarring or rashes. No rectal masses are appreciated. Prostate is approximately 45 grams, no nodules are appreciated. Seminal vesicles could not be palpated.  Neurologic: Grossly intact, no focal deficits, moving all 4 extremities. Psychiatric: Normal mood and affect.  Laboratory Data: Lab Results  Component Value Date   WBC 7.4 12/15/2022   HGB 17.2 (H) 12/15/2022   HCT 48.1 12/15/2022   MCV 88.3 12/15/2022   PLT 236 12/15/2022    Lab Results  Component  Value Date   CREATININE 0.85 12/15/2022    Lab Results  Component Value Date   AST 25 12/15/2022   Lab Results  Component Value Date   ALT 20 12/15/2022   Urinalysis See EPIC and HPI  I have reviewed the labs.   Pertinent Imaging: BLADDER SCAN AMB NON-IMAGING Order: [id number]  Status: Edited Result - FINAL     Next appt: None     Dx: Urethritis   Test Result Released: No (inaccessible in MyChart)   0 Result Notes    Component Ref Range & Units (hover) 3 d ago  Scan Result 58ml         Last  Resulted: 11/20/23 10:53    Assessment & Plan:    1. Urethritis  -patient had unprotected sexually intercourse recently -UA benign -urine culture pending -atypical culture pending -GC/chlamydia cultures pending -HSV, HIV, RPR pending  2. Prostatitis -azithromycin (Z-pak) and doxycycline 100 mg BID x 30 days    Return for Follow up pending cultures .  These notes generated with voice recognition software. I apologize for typographical errors.  Barry Zamora  [facility] [address]  [address] [place], [place] [address] [phone]

## 2023-11-20 ENCOUNTER — Ambulatory Visit (INDEPENDENT_AMBULATORY_CARE_PROVIDER_SITE_OTHER): Payer: Self-pay | Admitting: Urology

## 2023-11-20 ENCOUNTER — Encounter: Payer: Self-pay | Admitting: Urology

## 2023-11-20 VITALS — BP 143/79 | HR 65 | Ht 65.0 in | Wt 178.0 lb

## 2023-11-20 DIAGNOSIS — N410 Acute prostatitis: Secondary | ICD-10-CM

## 2023-11-20 DIAGNOSIS — N342 Other urethritis: Secondary | ICD-10-CM

## 2023-11-20 DIAGNOSIS — N411 Chronic prostatitis: Secondary | ICD-10-CM

## 2023-11-20 LAB — URINALYSIS, COMPLETE
Bilirubin, UA: NEGATIVE
Glucose, UA: NEGATIVE
Ketones, UA: NEGATIVE
Leukocytes,UA: NEGATIVE
Nitrite, UA: NEGATIVE
Protein,UA: NEGATIVE
RBC, UA: NEGATIVE
Specific Gravity, UA: <1.005 — ABNORMAL LOW (ref 1.005–1.030)
Urobilinogen, Ur: 0.2 mg/dL (ref 0.2–1.0)
pH, UA: 6 (ref 5.0–7.5)

## 2023-11-20 LAB — MICROSCOPIC EXAMINATION
Bacteria, UA: NONE SEEN
Epithelial Cells (non renal): NONE SEEN /[HPF] (ref 0–10)
RBC, Urine: NONE SEEN /[HPF] (ref 0–2)
WBC, UA: NONE SEEN /[HPF] (ref 0–5)

## 2023-11-20 LAB — BLADDER SCAN AMB NON-IMAGING

## 2023-11-20 MED ORDER — DOXYCYCLINE HYCLATE 100 MG PO CAPS: 100.0000 mg | ORAL_CAPSULE | Freq: Two times a day (BID) | ORAL | 0 refills | Status: DC

## 2023-11-20 MED ORDER — AZITHROMYCIN 250 MG PO TABS: ORAL_TABLET | ORAL | 0 refills | Status: DC

## 2023-11-21 LAB — HIV ANTIBODY (ROUTINE TESTING W REFLEX): HIV Screen 4th Generation wRfx: NONREACTIVE

## 2023-11-21 LAB — HSV 1 AND 2 AB, IGG
HSV 1 Glycoprotein G Ab, IgG: NONREACTIVE
HSV 2 IgG, Type Spec: NONREACTIVE

## 2023-11-21 LAB — RPR: RPR Ser Ql: NONREACTIVE

## 2023-11-23 LAB — CULTURE, URINE COMPREHENSIVE

## 2023-11-24 LAB — GC/CHLAMYDIA PROBE AMP
Chlamydia trachomatis, NAA: NEGATIVE
Neisseria Gonorrhoeae by PCR: NEGATIVE

## 2023-11-29 LAB — MYCOPLASMA / UREAPLASMA CULTURE
Mycoplasma hominis Culture: NEGATIVE
Ureaplasma urealyticum: NEGATIVE

## 2023-12-02 ENCOUNTER — Telehealth: Payer: Self-pay | Admitting: Urology

## 2023-12-02 NOTE — Telephone Encounter (Signed)
 1st attempt to reach pt, unable to LVM

## 2023-12-02 NOTE — Telephone Encounter (Signed)
 PT called to request lab results from visit 2/21 with [facility]. PT would like a call back to discuss results.

## 2023-12-03 NOTE — Telephone Encounter (Signed)
 2nd attempt to reach pt, unable to LVM

## 2023-12-04 ENCOUNTER — Other Ambulatory Visit: Payer: Self-pay | Admitting: Urology

## 2023-12-04 DIAGNOSIS — N5082 Scrotal pain: Secondary | ICD-10-CM

## 2023-12-04 DIAGNOSIS — N5089 Other specified disorders of the male genital organs: Secondary | ICD-10-CM

## 2023-12-31 NOTE — Progress Notes (Deleted)
 01/01/2024 9:15 AM   Barry Zamora 06/20/1976 [id number]  Referring provider: No referring provider defined for this encounter.  Urological history: 1. Urethritis -cysto (05/2023) - NED  No chief complaint on file.  HPI: Barry Zamora is a 48 y.o. male who presents today for patient is having pain in genital area; tip of penis, also has burning sensation with interpreter, Marchelle Folks.    Previous records reviewed.   At his visit on 11/20/2023, he states he had unprotected sex and would like extensive STI testing.   For the last month, he has had suprapubic pain, bilateral testicular pain, a burning pain at the end of his penis with dysuria.  He denied any penile discharge or penile rashes, lesions or ulcers.  Patient denies any modifying or aggravating factors.  Patient denies any recent UTI's, gross hematuria or suprapubic/flank pain.  Patient denies any fevers, chills, nausea or vomiting.  UA yellow clear, specific gravity < 1.005, pH 6.0 and microscopy nothing.  PVR 58 mL.  HSV is negative, RPR negative, HIV negative, GC and Chlamydia negative, Ureaplasma/mycoplasma negative, urine culture negative and his scrotal ultrasound is pending.  PMH: Past Medical History:  Diagnosis Date   Depression    Gastritis     Surgical History: No past surgical history on file.  Home Medications:  Allergies as of 01/01/2024   No Known Allergies      Medication List        Accurate as of December 31, 2023  9:15 AM. If you have any questions, ask your nurse or doctor.          azithromycin 250 MG tablet Commonly known as: Zithromax Take two tablets day one and one tablet days 2, 3, 4 and 5.   doxycycline 100 MG capsule Commonly known as: VIBRAMYCIN Take 1 capsule (100 mg total) by mouth every 12 (twelve) hours.        Allergies: No Known Allergies  Family History: No family history on file.  Social History:  reports that he has never smoked. He  has never used smokeless tobacco. He reports current alcohol use. He reports that he does not currently use drugs.  ROS: Pertinent ROS in HPI  Physical Exam: There were no vitals taken for this visit.  Constitutional:  Well nourished. Alert and oriented, No acute distress. HEENT: [place] AT, moist mucus membranes.  Trachea midline, no masses. Cardiovascular: No clubbing, cyanosis, or edema. Respiratory: Normal respiratory effort, no increased work of breathing. GI: Abdomen is soft, non tender, non distended, no abdominal masses. Liver and spleen not palpable.  No hernias appreciated.  Stool sample for occult testing is not indicated.   GU: No CVA tenderness.  No bladder fullness or masses.  Patient with circumcised/uncircumcised phallus. ***Foreskin easily retracted***  Urethral meatus is patent.  No penile discharge. No penile lesions or rashes. Scrotum without lesions, cysts, rashes and/or edema.  Testicles are located scrotally bilaterally. No masses are appreciated in the testicles. Left and right epididymis are normal. Rectal: Patient with  normal sphincter tone. Anus and perineum without scarring or rashes. No rectal masses are appreciated. Prostate is approximately *** grams, *** nodules are appreciated. Seminal vesicles are normal. Skin: No rashes, bruises or suspicious lesions. Lymph: No cervical or inguinal adenopathy. Neurologic: Grossly intact, no focal deficits, moving all 4 extremities. Psychiatric: Normal mood and affect.   Laboratory Data: Results for orders placed or performed in visit on 11/20/23  BLADDER SCAN AMB NON-IMAGING   Collection Time: 11/20/23 10:53  AM  Result Value Ref Range   Scan Result 58ml   CULTURE, URINE COMPREHENSIVE   Collection Time: 11/20/23 11:16 AM   Specimen: Urine   UR  Result Value Ref Range   Urine Culture, Comprehensive Final report    Organism ID, Bacteria Comment   Microscopic Examination   Collection Time: 11/20/23 11:16 AM   Urine   Result Value Ref Range   WBC, UA None seen 0 - 5 /hpf   RBC, Urine None seen 0 - 2 /hpf   Epithelial Cells (non renal) None seen 0 - 10 /hpf   Bacteria, UA None seen None seen/Few  Urinalysis, Complete   Collection Time: 11/20/23 11:16 AM  Result Value Ref Range   Specific Gravity, UA <1.005 (L) 1.005 - 1.030   pH, UA 6.0 5.0 - 7.5   Color, UA Yellow Yellow   Appearance Ur Clear Clear   Leukocytes,UA Negative Negative   Protein,UA Negative Negative/Trace   Glucose, UA Negative Negative   Ketones, UA Negative Negative   RBC, UA Negative Negative   Bilirubin, UA Negative Negative   Urobilinogen, Ur 0.2 0.2 - 1.0 mg/dL   Nitrite, UA Negative Negative   Microscopic Examination See below:   Mycoplasma / ureaplasma culture   Collection Time: 11/20/23 11:32 AM   Specimen: Blood   UR  Result Value Ref Range   Ureaplasma urealyticum Negative Negative   Mycoplasma hominis Culture Negative Negative  GC/Chlamydia Probe Amp   Collection Time: 11/20/23 11:32 AM   Specimen: Urine   UR  Result Value Ref Range   Chlamydia trachomatis, NAA Negative Negative   Neisseria Gonorrhoeae by PCR Negative Negative  HIV antibody (with reflex)   Collection Time: 11/20/23 11:33 AM  Result Value Ref Range   HIV Screen 4th Generation wRfx Non Reactive Non Reactive  RPR   Collection Time: 11/20/23 11:33 AM  Result Value Ref Range   RPR Ser Ql Non Reactive Non Reactive  HSV 1 and 2 Ab, IgG   Collection Time: 11/20/23 11:40 AM  Result Value Ref Range   HSV 1 Glycoprotein G Ab, IgG Non Reactive Non Reactive   HSV 2 IgG, Type Spec Non Reactive Non Reactive   Urinalysis See EPIC and HPI  I have reviewed the labs.   Pertinent Imaging: Scrotal US pending     Assessment & Plan:    1. Urethritis  -patient had unprotected sexually intercourse recently -UA benign -urine culture pending -atypical culture pending -GC/chlamydia cultures pending -HSV, HIV, RPR pending  2.  Prostatitis -azithromycin (Z-pak) and doxycycline 100 mg BID x 30 days    No follow-ups on file.  These notes generated with voice recognition software. I apologize for typographical errors.  Cloretta Ned  [facility] [address]  [address] [place], [place] [address] [phone]

## 2024-01-01 ENCOUNTER — Ambulatory Visit: Payer: Self-pay | Admitting: Urology

## 2024-01-01 DIAGNOSIS — N342 Other urethritis: Secondary | ICD-10-CM

## 2024-01-04 ENCOUNTER — Ambulatory Visit: Payer: Self-pay | Attending: Urology

## 2024-02-11 ENCOUNTER — Ambulatory Visit: Payer: Self-pay

## 2024-02-11 ENCOUNTER — Encounter: Payer: Self-pay | Admitting: *Deleted

## 2024-02-11 NOTE — Telephone Encounter (Signed)
  This encounter was created in error - please disregard.  See previous encounter from today for triage and recommends UC/ED for evaluation.

## 2024-02-11 NOTE — Telephone Encounter (Signed)
 Copied from CRM [id number]. Topic: Clinical - Red Word Triage >> Feb 11, 2024  2:37 PM Ivette P wrote: Red Word that prompted transfer to Nurse Triage:Pt is having pain down in his genital area, believes might have a fungus. Was at hospital back in November 20 2023 and radiology test, he stopped   Urologist -     Interpreter: Claudis Cumber [phone]

## 2024-02-11 NOTE — Telephone Encounter (Signed)
 Interpreter [place] ID # [id number]  Chief Complaint: pain, white discharge from penis and bilateral scrotal pain Symptoms: burning in urethra, white discharge, low abdominal pain, swelling end of penis. Tip of penis looks irritated. No issues passing urine. Has tried taking penicillin from "[place] store" and sx continue Frequency: 3 weeks  Pertinent Negatives: Patient denies fever no blood in urine no issues passing urine Disposition: [] ED /[x] Urgent Care (no appt availability in office) / [] Appointment(In office/virtual)/ []  [place] [facility]/ [] Home Care/ [] Refused Recommended Disposition /[]  Mobile Bus/ []  Follow-up with PCP Additional Notes:   Recommended mobile unit and patient lives to far away. Recommended urgent today or tomorrow for treatment. Care advise given . Patient requesting results from last visit at urology in [place]. Reviewed this NT can not review results and gave # [phone]  for urology practice for patient to call and request interpreter.new patient appt scheduled for patient at patient care center for 05/02/24. Patient concerned due to cost of treatments and is still paying for urology bill. Patient verbalized understanding to be seen at [facility] / ED today or tomorrow.        Reason for Disposition  Pus (white, yellow) or bloody discharge from end of penis  Answer Assessment - Initial Assessment Questions 1. SYMPTOM: "What's the main symptom you're concerned about?" (e.g., discharge from penis, rash, pain, itching, swelling)     Burning in urethra , discharge , swelling end of penis  2. LOCATION: "Where is the pain located?"     Genital area 3. ONSET: "When did sx  start?"     Few weeks 3 weeks 4. PAIN: "Is there any pain?" If Yes, ask: "How bad is it?"  (Scale 1-10; or mild, moderate, severe)     Moderate  5. URINE: "Any difficulty passing urine?" If Yes, ask: "When was the last time?"     no 6. CAUSE: "What do you think is causing the symptoms?"      White drainage from penis  7. OTHER SYMPTOMS: "Do you have any other symptoms?" (e.g., fever, abdomen pain, blood in urine)     No pain with urination , white discharge ffrom penis, low abdominal pain and bilateral testicles has tried to take penicillin from "[place] store" and sx continue  Protocols used: Penis and Scrotum Symptoms-A-AH

## 2024-02-14 ENCOUNTER — Other Ambulatory Visit: Payer: Self-pay

## 2024-02-14 ENCOUNTER — Ambulatory Visit (HOSPITAL_COMMUNITY)
Admission: EM | Admit: 2024-02-14 | Discharge: 2024-02-14 | Disposition: A | Discharge status: Home / Self Care | Payer: Self-pay | Attending: Emergency Medicine | Admitting: Emergency Medicine

## 2024-02-14 ENCOUNTER — Encounter (HOSPITAL_COMMUNITY): Payer: Self-pay | Admitting: Emergency Medicine

## 2024-02-14 DIAGNOSIS — Z113 Encounter for screening for infections with a predominantly sexual mode of transmission: Secondary | ICD-10-CM | POA: Insufficient documentation

## 2024-02-14 DIAGNOSIS — R300 Dysuria: Secondary | ICD-10-CM | POA: Insufficient documentation

## 2024-02-14 DIAGNOSIS — N451 Epididymitis: Secondary | ICD-10-CM | POA: Insufficient documentation

## 2024-02-14 LAB — POCT URINALYSIS DIP (MANUAL ENTRY)
Bilirubin, UA: NEGATIVE
Blood, UA: NEGATIVE
Glucose, UA: NEGATIVE mg/dL
Ketones, POC UA: NEGATIVE mg/dL
Leukocytes, UA: NEGATIVE
Nitrite, UA: NEGATIVE
Protein Ur, POC: NEGATIVE mg/dL
Spec Grav, UA: 1.015 (ref 1.010–1.025)
Urobilinogen, UA: 0.2 U/dL
pH, UA: 6 (ref 5.0–8.0)

## 2024-02-14 LAB — HIV ANTIBODY (ROUTINE TESTING W REFLEX): HIV Screen 4th Generation wRfx: NONREACTIVE

## 2024-02-14 MED ORDER — CIPROFLOXACIN HCL 500 MG PO TABS: 500.0000 mg | ORAL_TABLET | Freq: Two times a day (BID) | ORAL | 0 refills | Status: AC

## 2024-02-14 NOTE — ED Triage Notes (Signed)
 Burning sensation with urination and lower abd pain for the past 2 weeks.

## 2024-02-14 NOTE — Discharge Instructions (Signed)
 Start taking ciprofloxacin  twice daily for 5 days for coverage of possible epididymitis. Your results will return over the next few days and someone will call if results require any additional treatment. Call your urologist first thing tomorrow morning to follow-up regarding this issue. Return here as needed.  Comience a tomar ciprofloxacina dos veces al da durante 5 das para cubrir una posible epididimitis. Sus resultados estarn disponibles en los prximos das y alguien lo llamar si los resultados requieren algn tratamiento adicional. Llame a su urlogo maana a primera hora para hacer un seguimiento de este problema. Regrese aqu segn sea necesario.

## 2024-02-14 NOTE — ED Provider Notes (Signed)
 [facility]    CSN: [id number] Arrival date & time: 02/14/24  1456      History   Chief Complaint Chief Complaint  Patient presents with   Dysuria    HPI Barry Zamora is a 48 y.o. male.   Patient presents with burning with urination, testicular pain, and intermittent lower abdominal pain x 3 weeks.  Patient states that he is in the third become approximately worse over the last 3 days.  Patient is seen by urology and has been seen by them multiple times for similar symptoms.  Patient is requesting STD testing to ensure that he does not have an STD at this time.  Denies fever, bodies, chills, flank pain, hematuria, penile discharge, penile, testicular swelling.  The history is provided by the patient and medical records. The history is limited by a language barrier. A language interpreter was used (Spanish interpreter).  Dysuria Presenting symptoms: dysuria     Past Medical History:  Diagnosis Date   Depression    Gastritis     Patient Active Problem List   Diagnosis Date Noted   Gastritis 07/19/2022    History reviewed. No pertinent surgical history.     Home Medications    Prior to Admission medications   Medication Sig Start Date End Date Taking? Authorizing Provider  ciprofloxacin  (CIPRO ) 500 MG tablet Take 1 tablet (500 mg total) by mouth every 12 (twelve) hours for 5 days. 02/14/24 02/19/24 Yes Karon Packer, NP    Family History History reviewed. No pertinent family history.  Social History Social History   Tobacco Use   Smoking status: Never   Smokeless tobacco: Never  Vaping Use   Vaping status: Never Used  Substance Use Topics   Alcohol use: Yes    Comment: 2-3 beers after work   Drug use: Not Currently     Allergies   Patient has no known allergies.   Review of Systems Review of Systems  Genitourinary:  Positive for dysuria.   Per HPI  Physical Exam Triage Vital Signs ED Triage Vitals [02/14/24  1516]  Encounter Vitals Group     BP 135/74     Systolic BP Percentile      Diastolic BP Percentile      Pulse Rate 74     Resp 18     Temp 98.3 F (36.8 C)     Temp Source Oral     SpO2 98 %     Weight      Height      Head Circumference      Peak Flow      Pain Score 9     Pain Loc      Pain Education      Exclude from Growth Chart    No data found.  Updated Vital Signs BP 135/74 (BP Location: Right Arm)   Pulse 74   Temp 98.3 F (36.8 C) (Oral)   Resp 18   SpO2 98%   Visual Acuity Right Eye Distance:   Left Eye Distance:   Bilateral Distance:    Right Eye Near:   Left Eye Near:    Bilateral Near:     Physical Exam Vitals and nursing note reviewed.  Constitutional:      General: He is awake. He is not in acute distress.    Appearance: Normal appearance. He is well-developed and well-groomed. He is not ill-appearing.  Abdominal:     General: Abdomen is flat. Bowel sounds are normal.  There is no distension.     Palpations: Abdomen is soft. There is no mass.     Tenderness: There is no abdominal tenderness. There is no right CVA tenderness, left CVA tenderness, guarding or rebound.     Hernia: No hernia is present.  Genitourinary:    Comments: Exam deferred Skin:    General: Skin is warm and dry.  Neurological:     Mental Status: He is alert.  Psychiatric:        Behavior: Behavior is cooperative.      UC Treatments / Results  Labs (all labs ordered are listed, but only abnormal results are displayed) Labs Reviewed  URINE CULTURE  HIV ANTIBODY (ROUTINE TESTING W REFLEX)  RPR  POCT URINALYSIS DIP (MANUAL ENTRY)  CYTOLOGY, (ORAL, ANAL, URETHRAL) ANCILLARY ONLY    EKG   Radiology No results found.  Procedures Procedures (including critical care time)  Medications Ordered in UC Medications - No data to display  Initial Impression / Assessment and Plan / UC Course  I have reviewed the triage vital signs and the nursing notes.  Pertinent  labs & imaging results that were available during my care of the patient were reviewed by me and considered in my medical decision making (see chart for details).     Patient is well-appearing.  Vitals are stable.  No significant findings upon exam.  GU exam deferred.  Patient performed self swab for STD.  HIV and RPR ordered.  Urinalysis unremarkable, will send urine culture to confirm.  Empirically treating for epididymitis with ciprofloxacin .  Recommended following up with urology.  Discussed return precautions. Final Clinical Impressions(s) / UC Diagnoses   Final diagnoses:  Epididymitis  Screening for STD (sexually transmitted disease)  Dysuria     Discharge Instructions      Start taking ciprofloxacin  twice daily for 5 days for coverage of possible epididymitis. Your results will return over the next few days and someone will call if results require any additional treatment. Call your urologist first thing tomorrow morning to follow-up regarding this issue. Return here as needed.  Comience a tomar ciprofloxacina dos veces al da durante 5 das para cubrir una posible epididimitis. Sus resultados estarn disponibles en los prximos das y alguien lo llamar si los resultados requieren algn tratamiento adicional. Llame a su urlogo maana a primera hora para hacer un seguimiento de este problema. Regrese aqu segn sea necesario.  ED Prescriptions     Medication Sig Dispense Auth. Provider   ciprofloxacin  (CIPRO ) 500 MG tablet Take 1 tablet (500 mg total) by mouth every 12 (twelve) hours for 5 days. 10 tablet Levora Reas A, NP      PDMP not reviewed this encounter.   Levora Reas A, NP 02/14/24 [phone]

## 2024-02-15 LAB — CYTOLOGY, (ORAL, ANAL, URETHRAL) ANCILLARY ONLY
Chlamydia: NEGATIVE
Comment: NEGATIVE
Comment: NEGATIVE
Comment: NORMAL
Neisseria Gonorrhea: NEGATIVE
Trichomonas: NEGATIVE

## 2024-02-15 LAB — URINE CULTURE: Culture: NO GROWTH

## 2024-02-15 LAB — RPR: RPR Ser Ql: NONREACTIVE

## 2024-02-16 ENCOUNTER — Ambulatory Visit (HOSPITAL_COMMUNITY): Payer: Self-pay

## 2024-02-17 ENCOUNTER — Ambulatory Visit: Payer: Self-pay | Admitting: Urology

## 2024-02-17 NOTE — Telephone Encounter (Signed)
 Patient called requesting results. I discussed with the patient that all his results were normal. Patient is concerned because he is still having pain and would like to discuss possibly getting an injection to help his symptoms. Patient states that the earliest appointment that he could get with the urologist was August 4th and he does not want to wait that long. I discussed with the patient that if he was still having concerns he can return for reevaluation. Patient states that he works during the week but can come on the weekend. I informed the patient that he does not need an appointment and he can just walk in. I also informed the patient of our weekend hours from 10-6.

## 2024-05-02 ENCOUNTER — Ambulatory Visit: Payer: Self-pay | Admitting: Nurse Practitioner

## 2024-05-05 ENCOUNTER — Emergency Department (HOSPITAL_COMMUNITY): Payer: Self-pay

## 2024-05-05 ENCOUNTER — Other Ambulatory Visit: Payer: Self-pay

## 2024-05-05 ENCOUNTER — Emergency Department (HOSPITAL_COMMUNITY)
Admission: EM | Admit: 2024-05-05 | Discharge: 2024-05-05 | Disposition: A | Discharge status: Home / Self Care | Payer: Self-pay | Attending: Emergency Medicine | Admitting: Emergency Medicine

## 2024-05-05 ENCOUNTER — Encounter (HOSPITAL_COMMUNITY): Payer: Self-pay

## 2024-05-05 DIAGNOSIS — R1033 Periumbilical pain: Secondary | ICD-10-CM | POA: Insufficient documentation

## 2024-05-05 DIAGNOSIS — R1011 Right upper quadrant pain: Secondary | ICD-10-CM | POA: Insufficient documentation

## 2024-05-05 DIAGNOSIS — R1084 Generalized abdominal pain: Secondary | ICD-10-CM

## 2024-05-05 DIAGNOSIS — R1031 Right lower quadrant pain: Secondary | ICD-10-CM | POA: Insufficient documentation

## 2024-05-05 DIAGNOSIS — N179 Acute kidney failure, unspecified: Secondary | ICD-10-CM | POA: Insufficient documentation

## 2024-05-05 LAB — URINALYSIS, ROUTINE W REFLEX MICROSCOPIC
Bacteria, UA: NONE SEEN
Bilirubin Urine: NEGATIVE
Glucose, UA: NEGATIVE mg/dL
Ketones, ur: NEGATIVE mg/dL
Leukocytes,Ua: NEGATIVE
Nitrite: NEGATIVE
Protein, ur: NEGATIVE mg/dL
Specific Gravity, Urine: 1.011 (ref 1.005–1.030)
pH: 6 (ref 5.0–8.0)

## 2024-05-05 LAB — COMPREHENSIVE METABOLIC PANEL WITH GFR
ALT: 21 U/L (ref 0–44)
AST: 24 U/L (ref 15–41)
Albumin: 3.6 g/dL (ref 3.5–5.0)
Alkaline Phosphatase: 58 U/L (ref 38–126)
Anion gap: 10 (ref 5–15)
BUN: 16 mg/dL (ref 6–20)
CO2: 22 mmol/L (ref 22–32)
Calcium: 8.9 mg/dL (ref 8.9–10.3)
Chloride: 107 mmol/L (ref 98–111)
Creatinine, Ser: 1.77 mg/dL — ABNORMAL HIGH (ref 0.61–1.24)
GFR, Estimated: 47 mL/min — ABNORMAL LOW (ref >60)
Glucose, Bld: 115 mg/dL — ABNORMAL HIGH (ref 70–99)
Potassium: 3.8 mmol/L (ref 3.5–5.1)
Sodium: 139 mmol/L (ref 135–145)
Total Bilirubin: 1.5 mg/dL — ABNORMAL HIGH (ref 0.0–1.2)
Total Protein: 6.9 g/dL (ref 6.5–8.1)

## 2024-05-05 LAB — CBC
HCT: 46.5 % (ref 39.0–52.0)
Hemoglobin: 16.6 g/dL (ref 13.0–17.0)
MCH: 32.4 pg (ref 26.0–34.0)
MCHC: 35.7 g/dL (ref 30.0–36.0)
MCV: 90.6 fL (ref 80.0–100.0)
Platelets: 202 K/uL (ref 150–400)
RBC: 5.13 MIL/uL (ref 4.22–5.81)
RDW: 13.1 % (ref 11.5–15.5)
WBC: 8.8 K/uL (ref 4.0–10.5)
nRBC: 0 % (ref 0.0–0.2)

## 2024-05-05 LAB — LIPASE, BLOOD: Lipase: 29 U/L (ref 11–51)

## 2024-05-05 MED ORDER — POLYETHYLENE GLYCOL 3350 17 GM/SCOOP PO POWD: 510.0000 g | Freq: Once | ORAL | 0 refills | Status: AC

## 2024-05-05 MED ORDER — LACTATED RINGERS IV BOLUS
1000.0000 mL | Freq: Once | INTRAVENOUS | Status: AC
  Administered 2024-05-05: 1000 mL via INTRAVENOUS

## 2024-05-05 MED ORDER — DICYCLOMINE HCL 20 MG PO TABS: 20.0000 mg | ORAL_TABLET | Freq: Two times a day (BID) | ORAL | 0 refills | Status: DC

## 2024-05-05 MED ORDER — MORPHINE SULFATE (PF) 4 MG/ML IV SOLN
4.0000 mg | Freq: Once | INTRAVENOUS | Status: AC
  Administered 2024-05-05: 4 mg via INTRAVENOUS
  Filled 2024-05-05: qty 1

## 2024-05-05 MED ORDER — IOHEXOL 350 MG/ML SOLN
70.0000 mL | Freq: Once | INTRAVENOUS | Status: AC | PRN
  Administered 2024-05-05: 70 mL via INTRAVENOUS

## 2024-05-05 NOTE — ED Triage Notes (Signed)
 C/O lower abd pain that started 2 days ago. Pt states vomiting. Denies diarrhea/ chest pain.  Last BM was this morning.

## 2024-05-05 NOTE — ED Provider Notes (Signed)
 [facility] Provider Note  MDM   HPI/ROS:  Barry Zamora is a 48 y.o. male with a medical history as below who presents periumbilical and right lower quadrant abdominal pain has been occurring for 3 days.  Patient endorses nausea vomiting but denies fevers.  Last bowel movement this morning and regular.  Describes the pain as burning.  Denies any inciting injury  Physical exam is notable for: - Exquisite tenderness to palpation in the periumbilical and right lower quadrant.  Some mild tenderness to palpation in the right upper quadrant.  No peritonitis, no fluid wave or distention  On my initial evaluation, patient is:  -Vital signs stable. Patient afebrile, hemodynamically stable, and non-toxic appearing.  Differentials include Appendicitis, Bowel Obstruction, AAA, UTI, Pyelonephritis, Nephrolithiasis, Pancreatitis, Cholecystitis, Shingles, Perforated Bowel or Ulcer, Diverticulosis/itis, Ischemic Mesentery, Inflammatory Bowel Disease, Strangulated/Incarcerated Hernia,  Testicular Torsion, STD, Epididymitis, Orchitis .   Given this patient's focal tenderness in the right lower quadrant and periumbilical region I have high concern for appendicitis thus I believe CT is reasonable.  Unlikely bowel obstruction given recent bowel movements.  Hemodynamically intact without significant hypertension lowering concern for AAA however considered to.  No CVA tenderness that would be concerning for nephrolithiasis however labs and UA also obtained to rule out other kidney injury.  Results of his labs and imaging as below and are reassuring.  He is reporting that he is having some hard bowel movements which raises concern for possible constipation.  He is requesting stool softener at this time.  He does have an AKI which we discussed.  He received 2 L of IV fluid.  He continues to urinate without difficulty.  I instructed him to follow-up with his PCP  for lab recheck to ensure kidney function continues to improve.  He was written a prescription for MiraLAX , Bentyl  and discharged in stable condition with strict return precautions.  I also instructed the patient to call [phone] if he is unable to drive himself as he has concerns he has no family in the area.  Interpretations, interventions, and the patient's course of care are documented below.    Clinical Course as of 05/05/24 1531  Thu May 05, 2024  0805 EKG 12-Lead Rate 75, NSR, right axis deviation with normal intervals.  No STE or depression [RC]  0912 Creatinine(!): 1.77 AKI.  IVF given [RC]  0927 CBC unremarkable particularly no leukocytosis or anemia [RC]  1147 CT ABDOMEN PELVIS W CONTRAST No acute intra-abdominal abnormality.  Particular no appendicitis [RC]  1531 UA without evidence of UTI [RC]    Clinical Course User Index [RC] Sharyne Darina RAMAN, MD      Disposition:  I discussed the plan for discharge with the patient and/or their surrogate at bedside prior to discharge and they were in agreement with the plan and verbalized understanding of the return precautions provided. All questions answered to the best of my ability. Ultimately, the patient was discharged in stable condition with stable vital signs. I am reassured that they are capable of close follow up and good social support at home.   Clinical Impression:  1. Generalized abdominal pain     Rx / DC Orders ED Discharge Orders          Ordered    polyethylene glycol powder (GLYCOLAX /MIRALAX ) 17 GM/SCOOP powder   Once        05/05/24 1528    dicyclomine  (BENTYL ) 20 MG tablet  2 times daily  05/05/24 1528            The plan for this patient was discussed with Dr. Freddi, who voiced agreement and who oversaw evaluation and treatment of this patient.   Clinical Complexity A medically appropriate history, review of systems, and physical exam was performed.  My independent interpretations of EKG,  labs, and radiology are documented in the ED course above.   If decision rules were used in this patient's evaluation, they are listed below.   Click here for ABCD2, HEART and other calculatorsREFRESH Note before signing   Patient's presentation is most consistent with acute presentation with potential threat to life or bodily function.  Medical Decision Making Amount and/or Complexity of Data Reviewed Labs: ordered. Decision-making details documented in ED Course. Radiology: ordered. Decision-making details documented in ED Course. ECG/medicine tests:  Decision-making details documented in ED Course.  Risk OTC drugs. Prescription drug management.    HPI/ROS      See MDM section for pertinent HPI and ROS. A complete ROS was performed with pertinent positives/negatives noted above.   Past Medical History:  Diagnosis Date   Depression    Gastritis     History reviewed. No pertinent surgical history.    Physical Exam   Vitals:   05/05/24 1300 05/05/24 1330 05/05/24 1400 05/05/24 1506  BP: (!) 146/86 128/84 130/74 137/81  Pulse: 61 (!) 58 60 62  Resp: 16 13 16 18   Temp:    98 F (36.7 C)  TempSrc:    Oral  SpO2: 100% 99% 100% 100%  Weight:      Height:        Physical Exam Vitals and nursing note reviewed.  Constitutional:      General: He is not in acute distress.    Appearance: He is well-developed.  HENT:     Head: Normocephalic and atraumatic.  Eyes:     Conjunctiva/sclera: Conjunctivae normal.  Cardiovascular:     Rate and Rhythm: Normal rate and regular rhythm.     Heart sounds: No murmur heard. Pulmonary:     Effort: Pulmonary effort is normal. No respiratory distress.     Breath sounds: Normal breath sounds.  Abdominal:     Palpations: Abdomen is soft.     Tenderness: There is abdominal tenderness in the right upper quadrant, right lower quadrant and periumbilical area. There is rebound. There is no right CVA tenderness, left CVA tenderness or  guarding. Positive signs include McBurney's sign. Negative signs include Murphy's sign.  Musculoskeletal:        General: No swelling.     Cervical back: Neck supple.  Skin:    General: Skin is warm and dry.     Capillary Refill: Capillary refill takes less than 2 seconds.  Neurological:     Mental Status: He is alert.  Psychiatric:        Mood and Affect: Mood normal.      Procedures   If procedures were preformed on this patient, they are listed below:  Procedures   @BBSIG @   Please note that this documentation was produced with the assistance of voice-to-text technology and may contain errors.    Sharyne Darina RAMAN, MD 05/05/24 1531    Freddi Hamilton, MD 05/06/24 [phone]

## 2024-05-05 NOTE — ED Notes (Signed)
 Patient answered yes to screening question regarding receiving blood products. Prior FYI on account stated that he wouldn't. FYI removed.

## 2024-05-05 NOTE — ED Notes (Signed)
 Pt given water  so we can obtain UA

## 2024-05-05 NOTE — ED Notes (Signed)
 Patient transported to CT

## 2024-05-05 NOTE — Discharge Instructions (Signed)
 You were seen today for abdominal pain. While you were here we monitored your vitals, preformed a physical exam, and labs and CT scan. These were all reassuring and there is no indication for any further testing or intervention in the emergency department at this time.   Things to do:  - Follow up with your primary care provider within the next 1-2 weeks - Please take MiraLAX  16 caps and a 64 ounce Gatorade and drink it throughout the day until its gone or until you are having clear bowel movements. - Please take the Bentyl  as prescribed for your abdominal pain.  Return to the emergency department if you have any new or worsening symptoms including worsening pain, nausea vomiting, inability to have a bowel movement for multiple days., or if you have any other concerns.

## 2024-05-06 ENCOUNTER — Inpatient Hospital Stay (HOSPITAL_COMMUNITY)
Admission: EM | Admit: 2024-05-06 | Discharge: 2024-05-08 | DRG: 684 | Disposition: A | Discharge status: Home / Self Care | Payer: Self-pay | Attending: Internal Medicine | Admitting: Internal Medicine

## 2024-05-06 ENCOUNTER — Encounter (HOSPITAL_COMMUNITY): Payer: Self-pay

## 2024-05-06 ENCOUNTER — Other Ambulatory Visit: Payer: Self-pay

## 2024-05-06 DIAGNOSIS — K5900 Constipation, unspecified: Secondary | ICD-10-CM

## 2024-05-06 DIAGNOSIS — Z603 Acculturation difficulty: Secondary | ICD-10-CM | POA: Diagnosis present

## 2024-05-06 DIAGNOSIS — T508X5A Adverse effect of diagnostic agents, initial encounter: Secondary | ICD-10-CM | POA: Diagnosis present

## 2024-05-06 DIAGNOSIS — N1411 Contrast-induced nephropathy: Secondary | ICD-10-CM | POA: Diagnosis present

## 2024-05-06 DIAGNOSIS — N179 Acute kidney failure, unspecified: Principal | ICD-10-CM | POA: Diagnosis present

## 2024-05-06 DIAGNOSIS — E860 Dehydration: Secondary | ICD-10-CM

## 2024-05-06 DIAGNOSIS — K219 Gastro-esophageal reflux disease without esophagitis: Secondary | ICD-10-CM | POA: Diagnosis present

## 2024-05-06 LAB — CBC WITH DIFFERENTIAL/PLATELET
Abs Immature Granulocytes: 0.02 K/uL (ref 0.00–0.07)
Basophils Absolute: 0 K/uL (ref 0.0–0.1)
Basophils Relative: 0 %
Eosinophils Absolute: 0.1 K/uL (ref 0.0–0.5)
Eosinophils Relative: 2 %
HCT: 46.1 % (ref 39.0–52.0)
Hemoglobin: 16.1 g/dL (ref 13.0–17.0)
Immature Granulocytes: 0 %
Lymphocytes Relative: 19 %
Lymphs Abs: 1.3 K/uL (ref 0.7–4.0)
MCH: 31.9 pg (ref 26.0–34.0)
MCHC: 34.9 g/dL (ref 30.0–36.0)
MCV: 91.5 fL (ref 80.0–100.0)
Monocytes Absolute: 0.8 K/uL (ref 0.1–1.0)
Monocytes Relative: 11 %
Neutro Abs: 4.7 K/uL (ref 1.7–7.7)
Neutrophils Relative %: 68 %
Platelets: 184 K/uL (ref 150–400)
RBC: 5.04 MIL/uL (ref 4.22–5.81)
RDW: 12.9 % (ref 11.5–15.5)
WBC: 6.9 K/uL (ref 4.0–10.5)
nRBC: 0 % (ref 0.0–0.2)

## 2024-05-06 LAB — URINALYSIS, ROUTINE W REFLEX MICROSCOPIC
Bacteria, UA: NONE SEEN
Bilirubin Urine: NEGATIVE
Glucose, UA: NEGATIVE mg/dL
Ketones, ur: NEGATIVE mg/dL
Leukocytes,Ua: NEGATIVE
Nitrite: NEGATIVE
Protein, ur: NEGATIVE mg/dL
Specific Gravity, Urine: 1.003 — ABNORMAL LOW (ref 1.005–1.030)
pH: 6 (ref 5.0–8.0)

## 2024-05-06 LAB — COMPREHENSIVE METABOLIC PANEL WITH GFR
ALT: 19 U/L (ref 0–44)
AST: 24 U/L (ref 15–41)
Albumin: 3.5 g/dL (ref 3.5–5.0)
Alkaline Phosphatase: 52 U/L (ref 38–126)
Anion gap: 11 (ref 5–15)
BUN: 20 mg/dL (ref 6–20)
CO2: 24 mmol/L (ref 22–32)
Calcium: 8.8 mg/dL — ABNORMAL LOW (ref 8.9–10.3)
Chloride: 102 mmol/L (ref 98–111)
Creatinine, Ser: 2.26 mg/dL — ABNORMAL HIGH (ref 0.61–1.24)
GFR, Estimated: 35 mL/min — ABNORMAL LOW (ref >60)
Glucose, Bld: 165 mg/dL — ABNORMAL HIGH (ref 70–99)
Potassium: 3.6 mmol/L (ref 3.5–5.1)
Sodium: 137 mmol/L (ref 135–145)
Total Bilirubin: 0.8 mg/dL (ref 0.0–1.2)
Total Protein: 6.6 g/dL (ref 6.5–8.1)

## 2024-05-06 LAB — HEMOGLOBIN A1C
Hgb A1c MFr Bld: 5.3 % (ref 4.8–5.6)
Mean Plasma Glucose: 105 mg/dL

## 2024-05-06 LAB — CREATININE, URINE, RANDOM: Creatinine, Urine: 40 mg/dL

## 2024-05-06 LAB — LIPASE, BLOOD: Lipase: 31 U/L (ref 11–51)

## 2024-05-06 LAB — SODIUM, URINE, RANDOM: Sodium, Ur: 18 mmol/L

## 2024-05-06 MED ORDER — RIVAROXABAN 10 MG PO TABS
10.0000 mg | ORAL_TABLET | Freq: Every day | ORAL | Status: DC
  Administered 2024-05-06 – 2024-05-08 (×3): 10 mg via ORAL
  Filled 2024-05-06 (×3): qty 1

## 2024-05-06 MED ORDER — LACTATED RINGERS IV SOLN: INTRAVENOUS | Status: DC

## 2024-05-06 MED ORDER — POLYETHYLENE GLYCOL 3350 17 G PO PACK
17.0000 g | PACK | Freq: Every day | ORAL | Status: DC
  Administered 2024-05-06 – 2024-05-08 (×3): 17 g via ORAL
  Filled 2024-05-06 (×2): qty 1

## 2024-05-06 MED ORDER — SODIUM CHLORIDE 0.9 % IV BOLUS
1000.0000 mL | Freq: Once | INTRAVENOUS | Status: AC
  Administered 2024-05-06: 1000 mL via INTRAVENOUS

## 2024-05-06 MED ORDER — ACETAMINOPHEN 650 MG RE SUPP
650.0000 mg | Freq: Four times a day (QID) | RECTAL | Status: DC | PRN
Start: 2024-05-06 — End: 2024-05-08

## 2024-05-06 MED ORDER — DICYCLOMINE HCL 10 MG/ML IM SOLN
20.0000 mg | Freq: Once | INTRAMUSCULAR | Status: AC
  Administered 2024-05-06: 20 mg via INTRAMUSCULAR
  Filled 2024-05-06: qty 2

## 2024-05-06 MED ORDER — LACTATED RINGERS IV BOLUS
1000.0000 mL | Freq: Once | INTRAVENOUS | Status: AC
  Administered 2024-05-06: 1000 mL via INTRAVENOUS

## 2024-05-06 MED ORDER — SENNA 8.6 MG PO TABS
1.0000 | ORAL_TABLET | Freq: Every day | ORAL | Status: DC
  Administered 2024-05-06 – 2024-05-08 (×3): 8.6 mg via ORAL
  Filled 2024-05-06 (×3): qty 1

## 2024-05-06 MED ORDER — ALUM & MAG HYDROXIDE-SIMETH 200-200-20 MG/5ML PO SUSP
30.0000 mL | Freq: Once | ORAL | Status: AC
  Administered 2024-05-06: 30 mL via ORAL
  Filled 2024-05-06: qty 30

## 2024-05-06 MED ORDER — ACETAMINOPHEN 325 MG PO TABS
650.0000 mg | ORAL_TABLET | Freq: Four times a day (QID) | ORAL | Status: DC | PRN
  Administered 2024-05-06: 650 mg via ORAL
  Filled 2024-05-06: qty 2

## 2024-05-06 MED ORDER — MILK AND MOLASSES ENEMA
1.0000 | Freq: Once | RECTAL | Status: DC
  Filled 2024-05-06: qty 150

## 2024-05-06 NOTE — H&P (Addendum)
 Date: 05/06/2024               Patient Name:  Barry Zamora MRN: [id number]  DOB: [birth year] Age / Sex: 48 y.o., male   PCP: Pcp, No         Medical Service: Internal Medicine Teaching Service         Attending Physician: Dr. Rosan Dayton BROCKS, DO      First Contact: Armando Rossetti, MD     Pager:  [phone]  Second Contact: Dr. Libby Blanch, DO   Pager:  [phone]       After Hours  (After 5pm / First Contact Pager: [phone]  weekends / holidays): Second Contact Pager: [phone]   SUBJECTIVE   Chief Complaint: abdominal pain  History of Present Illness: Barry Zamora is a 48 y.o. male with no pertinent PMH.   Presents with generalized abdominal pain for past 3-4 days. Describes as cramping and burning pain. Endorses dehydration. 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 Drinks about 4 x 16 oz bottles a day. Denies urinary symptoms or groin pain. States empties bladder and denies any sensation of retention. Denies NSAID use including Goody powders or BC. No new medications. Endorses constipation and that is a chronic issue but has not been as painful. Passing gas but last BM was a few days ago and small in size. Straining on toilet. No bloody stools or melena. Endorses GERD symptoms but not taking anything. Bloated after meals. Denies chest pain, SOB, dysuria, hematuria, hernia. Does not take any medications at home. Does not have PCP.   Was seen in ED on 8/7 with CT a/p without acute findings.   ED Course: Vitals were BP 135/82, HF 71, afebrile, 100% on RA Labs significant for Cr 2.26, UA without signs of infection or RBC  Received NS bolus x1  Meds:  Patient denies any medications, supplements or vitamins.  No outpatient medications have been marked as taking for the 05/06/24 encounter [facility] Encounter).   Past Medical History Past Medical History:  Diagnosis Date   Depression    Gastritis    Past Surgical History History reviewed. No pertinent surgical  history.  Social:  Lives With: alone in [place] Occupation: landscaping  Support: friend Level of Function: independent with ADLs PCP: None (would like to establish care with [facility]) Substances: -Tobacco: denies -Alcohol: denies -Recreational Drug: denies  Family History:  History reviewed. No pertinent family history.  Allergies: Allergies as of 05/06/2024   (No Known Allergies)    Review of Systems: A complete ROS was negative except as per HPI.   OBJECTIVE:   Physical Exam: Blood pressure 127/79, pulse 60, temperature 98.4 F (36.9 C), temperature source Oral, resp. rate 16, height 5' 9.29 (1.76 m), weight 85 kg, SpO2 100%.  Constitutional: alert, laying in bed, in no acute distress HENT: normocephalic atraumatic, mucous membranes dry Cardiovascular: regular rate and rhythm Pulmonary/Chest: normal work of breathing on room air, lungs clear to auscultation bilaterally Abdominal: bowel sounds present, soft, non-distended, diffuse TTP without guarding or rebound  MSK: no LE edema Neurological: alert & oriented x 3 Skin: warm and dry, decreased skin turgor Psych: anxious mood   Labs: CBC    Component Value Date/Time   WBC 6.9 05/06/2024 0904   RBC 5.04 05/06/2024 0904   HGB 16.1 05/06/2024 0904   HCT 46.1 05/06/2024 0904   PLT 184 05/06/2024 0904   MCV 91.5 05/06/2024 0904   MCH 31.9 05/06/2024 0904   MCHC 34.9 05/06/2024 0904  RDW 12.9 05/06/2024 0904   LYMPHSABS 1.3 05/06/2024 0904   MONOABS 0.8 05/06/2024 0904   EOSABS 0.1 05/06/2024 0904   BASOSABS 0.0 05/06/2024 0904     CMP     Component Value Date/Time   NA 137 05/06/2024 0904   K 3.6 05/06/2024 0904   CL 102 05/06/2024 0904   CO2 24 05/06/2024 0904   GLUCOSE 165 (H) 05/06/2024 0904   BUN 20 05/06/2024 0904   CREATININE 2.26 (H) 05/06/2024 0904   CALCIUM 8.8 (L) 05/06/2024 0904   PROT 6.6 05/06/2024 0904   ALBUMIN 3.5 05/06/2024 0904   AST 24 05/06/2024 0904   ALT 19 05/06/2024 0904   ALKPHOS  52 05/06/2024 0904   BILITOT 0.8 05/06/2024 0904   GFRNONAA 35 (L) 05/06/2024 0904    Imaging:  CT ABDOMEN PELVIS W CONTRAST CLINICAL DATA:  Right lower quadrant pain and vomiting for 2 days.  EXAM: CT ABDOMEN AND PELVIS WITH CONTRAST  TECHNIQUE: Multidetector CT imaging of the abdomen and pelvis was performed using the standard protocol following bolus administration of intravenous contrast.  RADIATION DOSE REDUCTION: This exam was performed according to the departmental dose-optimization program which includes automated exposure control, adjustment of the mA and/or kV according to patient size and/or use of iterative reconstruction technique.  CONTRAST:  70mL OMNIPAQUE  IOHEXOL  350 MG/ML SOLN  COMPARISON:  07/19/2022  FINDINGS: Lower Chest: Mild bibasilar scarring or atelectasis.  Hepatobiliary: No suspicious hepatic masses identified. Probable tiny sub-cm cysts remain stable. Gallbladder is unremarkable. No evidence of biliary ductal dilatation.  Pancreas:  No mass or inflammatory changes.  Spleen: Within normal limits in size and appearance.  Adrenals/Urinary Tract: No suspicious masses identified. No evidence of ureteral calculi or hydronephrosis.  Stomach/Bowel: No evidence of obstruction, inflammatory process or abnormal fluid collections. Normal appendix visualized.  Vascular/Lymphatic: No pathologically enlarged lymph nodes. No acute vascular findings.  Reproductive:  No mass or other significant abnormality.  Other:  None.  Musculoskeletal:  No suspicious bone lesions identified.  IMPRESSION: Unremarkable. No evidence of appendicitis or other significant abnormality.  Electronically Signed   By: Norleen DELENA Kil M.D.   On: 05/05/2024 11:06   EKG: not obtained in ED  ASSESSMENT & PLAN:   Assessment & Plan by Problem: Principal Problem:   AKI (acute kidney injury) (HCC)   Barry Zamora is a 48 y.o. person living with no pertinent  PMH who presented with abdominal pain and admitted for AKI on hospital day 0  #AKI Scr 2.26 today from 1.77 yesterday at previous ED visit. Prior Scr was 0.85 in 11/2022. BUN creatinine ratio about 8-9. No urinary symptoms and UA without signs of UTI, no proteinuria or casts. Unclear if possible pre-renal with dehydration given inadequate fluid intake and working outside. Patient denies any signs of obstruction. CT a/p from yesterday without hydronephrosis or ureteral calculi noted. Will calculate FeNa to differentiate pre vs intrinsic.  - Trend renal function/BMP - LR 1L bolus then LR infusion x 1 day - Monitor I&Os - Urine Na and creatinine to evaluate FeNa  #Abdominal pain #Hx of constipation  Presents with 3-4 days of diffuse abd pain. N/v resolved. Reports hx of acid reflux symptoms but not taking any medications. Reports constipation and pebbles 2 days ago, not taken anything at home. CT a/p yesterday without acute findings. Lipase normal. BS present, less obstruction. Not acute abdomen on exam. HDS.  - Start bowel regimen and PRN suppository - Check H pylori   - Check A1c  -  Can start H2B  - IVF as above    Diet: Normal VTE ppx: DOAC IVF: LR,100cc/hr Abx: none  Code Status: Full Surrogate Decision Maker: Herschel Patch (relationship: friend)  Prior to Admission Living Arrangement: Home, living alone Anticipated Discharge Location: Home Barriers to Discharge: medical stability  Dispo: Admit patient to Observation with expected length of stay less than 2 midnights.  Signed: Elicia Sharper, DO Internal Medicine Resident PGY-3 05/06/2024, 12:45 PM   Please contact IM Residency On-Call Pager at: [phone] or [phone].

## 2024-05-06 NOTE — Hospital Course (Addendum)
 8/9 Lives alone here and he had symptoms 2 days ago,burning and cramping, feel really better,had BM but not normal, no burning, dribbling and pressure in urination but not dysuria or feeling retention, no output check. Drinking good amount of water , dc IV, no N/V or fever or chills, no muscle cramps, no nsaid, ck is normal. Exam: pain is in LLQ better than before, no guarding, no CVA tenderness. Keep here one more day to check Cr.  You are my nini, proud of you:)))                Summary:Barry Zamora is a 48 y.o. male with a medical history as below who presents periumbilical and right lower quadrant abdominal pain has been occurring for 3 days.  Patient endorses nausea vomiting but denies fevers.  Last bowel movement this morning and regular.  Describes the pain as burning.  Denies any inciting injury   48 year old Spanish-speaking male with significant history of gastritis and depression presenting to the ED for evaluation of abdominal pain.  History obtained using language interpreter.  Patient endorsed with the past 3 days he has had diffuse abdominal discomfort.  Described pain as a burning and sharp uncomfort sensation with associated nausea and vomiting and feeling constipated.  His last bowel movement was yesterday and he passed a small amount.  He does not endorse any fever or chills no chest pain or shortness of breath no productive cough no dysuria.  He denies alcohol or tobacco use and denies regular NSAID use or eating spicy food.  He was seen in the ED yesterday for his complaint and was given medication.  He mention his symptoms still persist.  He reports concerns for potential stomach cancer and would like to be evaluated for that.  He does not have a PCP.  No prior abdominal surgeries.  ED: Some mild tenderness to palpation in the right upper quadrant.  Vital signs overall reassuring.  Exquisite tenderness to palpation in the periumbilical and right lower  quadrant. creatinine of 2.26 compared to 1.77-day ago. Will give IV hydration. UA without signs of UTI, electrolyte panels are overall reassuring except for kidney function, normal WBC, normal H&H  and EMR reviewed patient was seen yesterday for the same complaint and had a CT scan of his abdomen pelvis that was obtained which shows no acute finding, give GI cocktail and Bentyl , fter these medicines showed that the patient improved   PMHX: depression, gastritis Surg: no prior surgery Social: denies alcohol and tobacco O/N:no VS: I/O (If pert): CBC:  CMP: Images:     #AKI Scr 2.26 today from 1.77 yesterday at previous ED visit. Prior Scr was 0.85 in 11/2022. BUN creatinine ratio about 8-9. No urinary symptoms and UA without signs of UTI, no proteinuria or casts. Unclear if possible pre-renal with dehydration given inadequate fluid intake and working outside. Patient denies any signs of obstruction. CT a/p from yesterday without hydronephrosis or ureteral calculi noted. Will calculate FeNa to differentiate pre vs intrinsic.  - Trend renal function/BMP - LR 1L bolus then LR infusion x 1 day - Monitor I&Os - Urine Na and creatinine to evaluate FeNa   #Abdominal pain #Hx of constipation  Presents with 3-4 days of diffuse abd pain. N/v resolved. Reports hx of acid reflux symptoms but not taking any medications. Reports constipation and pebbles 2 days ago, not taken anything at home. CT a/p yesterday without acute findings. Lipase normal. BS present, less obstruction. Not acute abdomen on exam.  HDS.  - Start bowel regimen and PRN suppository - Check H pylori   - Check A1c  - Can start H2B  - IVF as above      Diet: Normal VTE ppx: DOAC IVF: LR,100cc/hr Abx: none   8/10 He's good, stomach doesn't hurt anymore, able to walk around, less bloated, he pooped yesterday and this morning. Wants something for constipation, he tries to eat and he gets fuller and bloated. He doesn't want to leave  without meds so he can prevent this in future. Wait for kidneys to get better before milk of mg. We giving him something else. ER gave him bentyl . Plan for today: go home, make sure his kidneys are better so checking his blood one more time, if its worse you have to stay. He wants more prune juice. No advil, naproxen, or tylenol . Will have fu appt w us . He's v happy here and thankful for us .

## 2024-05-06 NOTE — TOC CM/SW Note (Signed)
 Transition of Care [facility]) - Inpatient Brief Assessment   Patient Details  Name: Barry Zamora MRN: [id number] Date of Birth: [birth year]  Transition of [facility]) CM/SW Contact:    Lauraine FORBES Saa, LCSWA Phone Number: 05/06/2024, 4:25 PM   Clinical Narrative:  4:25 PM Per chart review, patient resides at home with friends and relatives. Patient does not have a PCP or insurance. CSW consulted financial counseling for further insurance assistance. Patient does not have SNF/HH/DME history. Patient's preferred pharmacy's are [facility] and [facility] [address]. TOC will continue to follow and be available to assist.  Transition of Care Asessment: Insurance and Status: Selfpay Patient has primary care physician: No Home environment has been reviewed: Private Residence Prior level of function:: N/A Prior/Current Home Services: No current home services Social Drivers of Health Review: SDOH reviewed no interventions necessary Readmission risk has been reviewed: Yes (Currently Observation Status) Transition of care needs: transition of care needs identified, TOC will continue to follow

## 2024-05-06 NOTE — Discharge Summary (Signed)
 Name: Barry Zamora MRN: [id number] DOB: 1976-04-15 48 y.o. PCP: Pcp, No  Date of Admission: 05/06/2024  8:32 AM Date of Discharge: 05/07/2024 Attending Physician: Dr. Mliss Foot  Discharge Diagnosis: 1. Principal Problem:   AKI (acute kidney injury) [facility])   Discharge Medications: Allergies as of 05/06/2024   No Known Allergies   Med Rec must be completed prior to using this [facility]***       Disposition and follow-up:   Barry Zamora was discharged from [facility] in Good condition.  At the hospital follow up visit please address: 1.Reassess the patient for any recurrence of abdominal pain, dyspepsia, or reflux symptoms, and confirm whether constipation has resolved.  2.Review the patient's hydration status and counsel on maintaining adequate daily fluid intake. Reinforce instructions for preventing dehydration while working outdoors, including regular water  breaks and avoiding prolonged exposure to heat to reduce risk of dehydration-related illness.  3.Labs / imaging needed at time of follow-up: CBC, BMP  Follow-up Appointments:    Hospital Course by problem list:   Barry Zamora is a 48 y.o. person living with no pertinent PMH who presented with abdominal pain and admitted for AKI, now being discharged on hospital day 0 with the following pertinent hospital course:  1. Acute Kidney Injury (AKI) On admission, the patient's creatinine was 2.26, up from 1.77 the day prior and significantly above his baseline of 0.85 in March 2024. He reported no urinary symptoms, and urinalysis showed no evidence of infection, proteinuria, or casts. Given his recent history of working outdoors and low fluid intake, a pre-renal etiology from dehydration was suspected. CT abdomen/pelvis performed the previous day showed no hydronephrosis or ureteral stones. He was treated with a 1L lactated Ringer 's bolus followed by continuous IV  fluids, and urine sodium/creatinine was sent to help assess etiology. Renal function improved rapidly, and by discharge, his creatinine had returned to baseline, consistent with pre-renal AKI.  2. Abdominal Pain / Constipation The patient presented with 3,4 days of diffuse abdominal pain, primarily in the periumbilical and right lower quadrant, described as burning and sharp, associated with nausea and vomiting that resolved prior to admission. He also reported constipation, with only small, pebble-like stools two days before presentation. Examination showed no signs of acute abdomen, and bowel sounds were present. CT abdomen/pelvis from the day before admission showed no acute pathology. Lipase was normal, making pancreatitis unlikely. His pain improved after receiving a GI cocktail and dicyclomine  in the ED. During hospitalization, he was started on a bowel regimen with as-needed suppositories and IV fluids. H. pylori and HbA1c testing were ordered for outpatient follow-up. He tolerated a normal diet and had bowel movement improvement before discharge.  3. History of Gastritis The patient has a history of acid reflux symptoms but is not on regular acid suppression therapy. During admission, he denied exacerbation of reflux with meals, and there were no signs of gastrointestinal bleeding. He was started on an H2 blocker for symptom prevention, with a plan for outpatient follow-up and further evaluation if symptoms persist.  Disposition The patient's AKI resolved with IV hydration, and his abdominal pain improved with supportive care and bowel regimen. He was tolerating oral intake, ambulating without difficulty, and medically stable for discharge home. He was advised to maintain adequate hydration, follow up for review of pending H. pylori and HbA1c results, and establish primary care for ongoing management of his gastritis and constipation.    Subjective  Discharge Exam:   BP 133/73 (BP Location:  Right Arm)   Pulse 63   Temp 98.1 F (36.7 C) (Oral)   Resp 16   Ht 5' 9 (1.753 m)   Wt 80.7 kg   SpO2 100%   BMI 26.27 kg/m  Constitutional: alert, laying in bed, in no acute distress HENT: normocephalic atraumatic, mucous membranes dry Cardiovascular: regular rate and rhythm Pulmonary/Chest: normal work of breathing on room air, lungs clear to auscultation bilaterally Abdominal: bowel sounds present, soft, non-distended, diffuse TTP without guarding or rebound  MSK: no LE edema Neurological: alert & oriented x 3 Skin: warm and dry   Pertinent Labs, Studies, and Procedures:     Latest Ref Rng & Units 05/06/2024    9:04 AM 05/05/2024    7:56 AM 12/15/2022   10:43 AM  CBC  WBC 4.0 - 10.5 K/uL 6.9  8.8  7.4   Hemoglobin 13.0 - 17.0 g/dL 83.8  83.3  82.7   Hematocrit 39.0 - 52.0 % 46.1  46.5  48.1   Platelets 150 - 400 K/uL 184  202  236        Latest Ref Rng & Units 05/06/2024    9:04 AM 05/05/2024    7:56 AM 12/15/2022   10:43 AM  CMP  Glucose 70 - 99 mg/dL 834  884  879   BUN 6 - 20 mg/dL 20  16  7    Creatinine 0.61 - 1.24 mg/dL 7.73  8.22  9.14   Sodium 135 - 145 mmol/L 137  139  134   Potassium 3.5 - 5.1 mmol/L 3.6  3.8  3.5   Chloride 98 - 111 mmol/L 102  107  103   CO2 22 - 32 mmol/L 24  22  23    Calcium 8.9 - 10.3 mg/dL 8.8  8.9  8.8   Total Protein 6.5 - 8.1 g/dL 6.6  6.9  7.0   Total Bilirubin 0.0 - 1.2 mg/dL 0.8  1.5  1.0   Alkaline Phos 38 - 126 U/L 52  58  59   AST 15 - 41 U/L 24  24  25    ALT 0 - 44 U/L 19  21  20      No results found.   Discharge Instructions:   Signed: Bernadine Manos, MD 05/06/2024, 9:57 PM

## 2024-05-06 NOTE — ED Provider Notes (Signed)
 [place] EMERGENCY DEPARTMENT AT [facility] Provider Note   CSN: [id number] Arrival date & time: 05/06/24  9168     Patient presents with: Abdominal Pain   Barry Zamora is a 48 y.o. male.   The history is provided by the patient and medical records. The history is limited by a language barrier. A language interpreter was used.  Abdominal Pain    48 year old Spanish-speaking male with significant history of gastritis and depression presenting to the ED for evaluation of abdominal pain.  History obtained using language interpreter.  Patient endorsed with the past 3 days he has had diffuse abdominal discomfort.  Described pain as a burning and sharp uncomfort sensation with associated nausea and vomiting and feeling constipated.  His last bowel movement was yesterday and he passed a small amount.  He does not endorse any fever or chills no chest pain or shortness of breath no productive cough no dysuria.  He denies alcohol or tobacco use and denies regular NSAID use or eating spicy food.  He was seen in the ED yesterday for his complaint and was given medication.  He mention his symptoms still persist.  He reports concerns for potential stomach cancer and would like to be evaluated for that.  He does not have a PCP.  No prior abdominal surgeries.  Prior to Admission medications   Medication Sig Start Date End Date Taking? Authorizing Provider  dicyclomine  (BENTYL ) 20 MG tablet Take 1 tablet (20 mg total) by mouth 2 (two) times daily. 05/05/24   Sharyne Darina RAMAN, MD    Allergies: Patient has no known allergies.    Review of Systems  Gastrointestinal:  Positive for abdominal pain.  All other systems reviewed and are negative.   Updated Vital Signs BP 139/89   Pulse 71   Temp 98.4 F (36.9 C) (Oral)   Resp 18   Ht 5' 9.29 (1.76 m)   Wt 85 kg   SpO2 100%   BMI 27.44 kg/m   Physical Exam Constitutional:      General: He is not in acute distress.     Appearance: He is well-developed.  HENT:     Head: Atraumatic.  Eyes:     Conjunctiva/sclera: Conjunctivae normal.  Cardiovascular:     Rate and Rhythm: Normal rate and regular rhythm.  Pulmonary:     Effort: Pulmonary effort is normal.     Breath sounds: Normal breath sounds.  Abdominal:     General: Bowel sounds are normal.     Palpations: Abdomen is soft.     Tenderness: There is no guarding or rebound. Negative signs include Murphy's sign and Rovsing's sign.     Hernia: No hernia is present.  Genitourinary:    Comments: Chaperone present during exam.  Normal rectal tone no obvious mass no impacted stool and no blood on glove Musculoskeletal:     Cervical back: Normal range of motion and neck supple.  Skin:    Findings: No rash.  Neurological:     Mental Status: He is alert.     (all labs ordered are listed, but only abnormal results are displayed) Labs Reviewed  COMPREHENSIVE METABOLIC PANEL WITH GFR - Abnormal; Notable for the following components:      Result Value   Glucose, Bld 165 (*)    Creatinine, Ser 2.26 (*)    Calcium 8.8 (*)    GFR, Estimated 35 (*)    All other components within normal limits  URINALYSIS, ROUTINE W REFLEX  MICROSCOPIC - Abnormal; Notable for the following components:   Color, Urine STRAW (*)    Specific Gravity, Urine 1.003 (*)    Hgb urine dipstick SMALL (*)    All other components within normal limits  CBC WITH DIFFERENTIAL/PLATELET  LIPASE, BLOOD    EKG: None  Radiology: CT ABDOMEN PELVIS W CONTRAST Result Date: 05/05/2024 CLINICAL DATA:  Right lower quadrant pain and vomiting for 2 days. EXAM: CT ABDOMEN AND PELVIS WITH CONTRAST TECHNIQUE: Multidetector CT imaging of the abdomen and pelvis was performed using the standard protocol following bolus administration of intravenous contrast. RADIATION DOSE REDUCTION: This exam was performed according to the departmental dose-optimization program which includes automated exposure control,  adjustment of the mA and/or kV according to patient size and/or use of iterative reconstruction technique. CONTRAST:  70mL OMNIPAQUE  IOHEXOL  350 MG/ML SOLN COMPARISON:  07/19/2022 FINDINGS: Lower Chest: Mild bibasilar scarring or atelectasis. Hepatobiliary: No suspicious hepatic masses identified. Probable tiny sub-cm cysts remain stable. Gallbladder is unremarkable. No evidence of biliary ductal dilatation. Pancreas:  No mass or inflammatory changes. Spleen: Within normal limits in size and appearance. Adrenals/Urinary Tract: No suspicious masses identified. No evidence of ureteral calculi or hydronephrosis. Stomach/Bowel: No evidence of obstruction, inflammatory process or abnormal fluid collections. Normal appendix visualized. Vascular/Lymphatic: No pathologically enlarged lymph nodes. No acute vascular findings. Reproductive:  No mass or other significant abnormality. Other:  None. Musculoskeletal:  No suspicious bone lesions identified. IMPRESSION: Unremarkable. No evidence of appendicitis or other significant abnormality. Electronically Signed   By: Norleen DELENA Kil M.D.   On: 05/05/2024 11:06     Procedures   Medications Ordered in the ED  milk and molasses enema (has no administration in time range)  alum & mag hydroxide-simeth (MAALOX/MYLANTA) 200-200-20 MG/5ML suspension 30 mL (30 mLs Oral Given 05/06/24 0916)  dicyclomine  (BENTYL ) injection 20 mg (20 mg Intramuscular Given 05/06/24 0914)  sodium chloride  0.9 % bolus 1,000 mL (1,000 mLs Intravenous New Bag/Given 05/06/24 1033)                                    Medical Decision Making Amount and/or Complexity of Data Reviewed Labs: ordered.  Risk OTC drugs. Prescription drug management.   BP 135/82   Pulse 71   Temp 98.4 F (36.9 C) (Oral)   Resp 18   Ht 5' 9.29 (1.76 m)   Wt 85 kg   SpO2 100%   BMI 27.44 kg/m   19:3 AM  48 year old Spanish-speaking male with significant history of gastritis and depression presenting to the ED  for evaluation of abdominal pain.  History obtained using language interpreter.  Patient endorsed with the past 3 days he has had diffuse abdominal discomfort.  Described pain as a burning and sharp uncomfort sensation with associated nausea and vomiting and feeling constipated.  His last bowel movement was yesterday and he passed a small amount.  He does not endorse any fever or chills no chest pain or shortness of breath no productive cough no dysuria.  He denies alcohol or tobacco use and denies regular NSAID use or eating spicy food.  He was seen in the ED yesterday for his complaint and was given medication.  He mention his symptoms still persist.  He reports concerns for potential stomach cancer and would like to be evaluated for that.  He does not have a PCP.  No prior abdominal surgeries.  On exam patient appears uncomfortable.  He has diffuse abdominal tenderness on palpation without focal point tenderness.  Rectal exam without any stool impaction.  Vital signs overall reassuring.  EMR reviewed patient was seen yesterday for the same complaint and had a CT scan of his abdomen pelvis that was obtained which shows no acute finding.  Patient was provided supportive care.  Plan to recheck labs, will give GI cocktail and Bentyl  and will continue to monitor.  -Labs ordered, independently viewed and interpreted by me.  Labs remarkable for worsening creatinine of 2.26 compared to 1.77-day ago.  Will give IV hydration.  UA without signs of UTI, electrolyte panels are overall reassuring except for kidney function, normal WBC, normal H&H -The patient was maintained on a cardiac monitor.  I personally viewed and interpreted the cardiac monitored which showed an underlying rhythm of: Sinus rhythm -Imaging including abdominal pelvis CT scan that was obtained yesterday independently viewed interpreted by me and are reassuring -This patient presents to the ED for concern of abdominal pain, this involves an  extensive number of treatment options, and is a complaint that carries with it a high risk of complications and morbidity.  The differential diagnosis includes gastritis, GERD, pancreatitis, cholecystitis, appendicitis, colitis, diverticulitis, SBO, UTI -Co morbidities that complicate the patient evaluation includes language barrier -Treatment includes IV fluid, GI cocktail, Bentyl  -Reevaluation of the patient after these medicines showed that the patient improved -PCP office notes or outside notes reviewed -Discussion with specialist internal medicine resident who agrees to admit pt for AKI -Escalation to admission/observation considered: patient agrees with admission.    Will give milk & molasses enema for constipation     Final diagnoses:  AKI (acute kidney injury) (HCC)  Constipation, unspecified constipation type    ED Discharge Orders     None          Nivia Colon, PA-C 05/06/24 1128    Francesca Elsie CROME, MD 05/06/24 [phone]

## 2024-05-06 NOTE — Plan of Care (Signed)

## 2024-05-06 NOTE — ED Triage Notes (Signed)
 Pt came in via POV d/t continued abd pain, feeling bloated, hard to walk, not sleeping well, burning sensation & pain rated 9/10. Does endorse feeling n/v & constipated & LBM yesterday morning & a small amt stool passed since. Is passing gas, pt reports he is concerned he may have some type of infection.

## 2024-05-07 DIAGNOSIS — R109 Unspecified abdominal pain: Secondary | ICD-10-CM

## 2024-05-07 DIAGNOSIS — N179 Acute kidney failure, unspecified: Principal | ICD-10-CM

## 2024-05-07 LAB — BASIC METABOLIC PANEL WITH GFR
Anion gap: 9 (ref 5–15)
BUN: 16 mg/dL (ref 6–20)
CO2: 25 mmol/L (ref 22–32)
Calcium: 8.6 mg/dL — ABNORMAL LOW (ref 8.9–10.3)
Chloride: 107 mmol/L (ref 98–111)
Creatinine, Ser: 2.46 mg/dL — ABNORMAL HIGH (ref 0.61–1.24)
GFR, Estimated: 32 mL/min — ABNORMAL LOW (ref >60)
Glucose, Bld: 142 mg/dL — ABNORMAL HIGH (ref 70–99)
Potassium: 3.6 mmol/L (ref 3.5–5.1)
Sodium: 141 mmol/L (ref 135–145)

## 2024-05-07 LAB — CBC
HCT: 43.1 % (ref 39.0–52.0)
Hemoglobin: 15.2 g/dL (ref 13.0–17.0)
MCH: 32.2 pg (ref 26.0–34.0)
MCHC: 35.3 g/dL (ref 30.0–36.0)
MCV: 91.3 fL (ref 80.0–100.0)
Platelets: 185 K/uL (ref 150–400)
RBC: 4.72 MIL/uL (ref 4.22–5.81)
RDW: 12.8 % (ref 11.5–15.5)
WBC: 6.2 K/uL (ref 4.0–10.5)
nRBC: 0 % (ref 0.0–0.2)

## 2024-05-07 LAB — CK: Total CK: 190 U/L (ref 49–397)

## 2024-05-07 MED ORDER — FAMOTIDINE 20 MG PO TABS
20.0000 mg | ORAL_TABLET | Freq: Every day | ORAL | Status: DC
  Administered 2024-05-07 – 2024-05-08 (×2): 20 mg via ORAL
  Filled 2024-05-07 (×2): qty 1

## 2024-05-07 MED ORDER — ORAL CARE MOUTH RINSE: 15.0000 mL | OROMUCOSAL | Status: DC | PRN

## 2024-05-07 NOTE — Progress Notes (Signed)
 HD#0 SUBJECTIVE:  Patient Summary: Barry Zamora is a 48 y.o. with a pertinent PMH of constipation and gastritis, who presented with abdominal pain and admitted for AKI.   Overnight Events: none  Interim History: Patient is feel a lot better this morning, with improved pain, burning, and cramping of the abdomen. He has not had any nausea, vomiting, fever, or chills since admission. He recalls having some n/v two days ago when the pain began. He has had some bowel movements since admission, but they have been small and pebble-like. He notes that when he pees it is a full stream that is easy to start, but sometimes has dribbling at the end. He does not feel that his bladder is still/the urge to urinate after finishing peeing. He denies muscle cramps or soreness in the bilateral arms or legs. He denies NSAID use. He has been drinking much more water  since getting to the hospital, and is focusing on hydrating himself. He is concerned about getting back to work after this admission, and also the cost of this hospital stay.   OBJECTIVE:  Vital Signs: Vitals:   05/06/24 1915 05/07/24 0032 05/07/24 0423 05/07/24 0727  BP: 133/73 134/71 131/63 123/77  Pulse: 63 63 (!) 51 (!) 58  Resp: 16 16 16    Temp: 98.1 F (36.7 C) 98.2 F (36.8 C) 99.6 F (37.6 C) 98 F (36.7 C)  TempSrc: Oral Oral Oral Oral  SpO2: 100% 98% 99% 99%  Weight:      Height:       Supplemental O2: Room Air SpO2: 99 %  Filed Weights   05/06/24 0845 05/06/24 1330  Weight: 85 kg 80.7 kg     Intake/Output Summary (Last 24 hours) at 05/07/2024 1201 Last data filed at 05/06/2024 1725 Gross per 24 hour  Intake 1152.75 ml  Output --  Net 1152.75 ml   Net IO Since Admission: 1,152.75 mL [05/07/24 1201]  Physical Exam: Physical Exam Constitutional: Patient awake, alert and sitting on the edge of the bed in no acute distress HENT: Normocephalic, atraumatic Card: RRR, No MRG Resp: LCTAB, no increased work of  breathing Abd: Soft, non-distended. Normoactive bowel sounds in all four quadrants. Slight tenderness to percussion on bilateral percussion and palpation of the lower abdomen. No CVA tenderness.  Psych: Normal thought content and mood.    Patient Lines/Drains/Airways Status     Active Line/Drains/Airways     Name Placement date Placement time Site Days   Peripheral IV 05/06/24 20 G Anterior;Distal;Left;Upper Arm 05/06/24  0903  Arm  1            Pertinent labs and imaging:  CK: 190 A1C: 5.3 H. Pylori: pending Creatinine Urine: 40 Sodium Urine: 18 Lipase 31    Latest Ref Rng & Units 05/07/2024    5:07 AM 05/06/2024    9:04 AM 05/05/2024    7:56 AM  CBC  WBC 4.0 - 10.5 K/uL 6.2  6.9  8.8   Hemoglobin 13.0 - 17.0 g/dL 84.7  83.8  83.3   Hematocrit 39.0 - 52.0 % 43.1  46.1  46.5   Platelets 150 - 400 K/uL 185  184  202        Latest Ref Rng & Units 05/07/2024    5:07 AM 05/06/2024    9:04 AM 05/05/2024    7:56 AM  CMP  Glucose 70 - 99 mg/dL 857  834  884   BUN 6 - 20 mg/dL 16  20  16  Creatinine 0.61 - 1.24 mg/dL 7.53  7.73  8.22   Sodium 135 - 145 mmol/L 141  137  139   Potassium 3.5 - 5.1 mmol/L 3.6  3.6  3.8   Chloride 98 - 111 mmol/L 107  102  107   CO2 22 - 32 mmol/L 25  24  22    Calcium 8.9 - 10.3 mg/dL 8.6  8.8  8.9   Total Protein 6.5 - 8.1 g/dL  6.6  6.9   Total Bilirubin 0.0 - 1.2 mg/dL  0.8  1.5   Alkaline Phos 38 - 126 U/L  52  58   AST 15 - 41 U/L  24  24   ALT 0 - 44 U/L  19  21     No results found.  ASSESSMENT/PLAN:  Assessment: Principal Problem:   AKI (acute kidney injury) (HCC)   Plan: #AKI Scr 2.46 today from 2.26 yesterday and 1.77 two days ago. Prior Scr was 0.85 in 11/2022. Patient denies dysuria, incomplete voiding, difficulty initiating stream. No output recorded in the last 24 hours. Patient has constipation and is currently on a bowel regimen, which has helped him have some bowel movements since admission, but they are still pebbly. He  denies muscle soreness and CK WNL, lower suspicion for rhabdo in setting of dehydration. Of note, pt had CT with contrast two days ago, and this can cause Cr elevation. No hydronephrosis or ureteral calculi noted on CT two days ago. FeNa 0.7%, suggesting prerenal etiology. Important to focus on consistent oral hydration and strict I/Os in this time period.  - trend renal function/BMP - hold LR infusion for now and encourage oral hydration - Monitor I/Os  #Abdominal pain #hx or constipation Patient has improved abdominal pain, notices less distention, and less burning and abdominal cramping. He feels that the bowel regimen has helped him and he is having more bowel movements, but they are still pebbly. He is focusing on oral hydration. Normal A1C less concerning for gastroparesis picture. He has no signs of peritonitis or obstruction per history or exam today. He is not using NSAIDs.  - continue bowel regimen and PRN suppository - H pylori pending - continue H2B - encourage oral hydration   Best Practice: Diet: Regular diet VTE: rivaroxaban  (XARELTO ) tablet 10 mg Start: 05/06/24 1230 Code: Full  Disposition planning: Therapy Recs: Pending, DME: none Family Contact: friend Herschel, to be notified. DISPO: Anticipated discharge pending to Home pending clinical improvement.  Signature:  Brad Prey MS3  12:01 PM, 05/07/2024  On Call pager [phone]  Attestation for Student Documentation:  I personally was present and re-performed the history, physical exam and medical decision-making activities of this service and have verified that the service and findings are accurately documented in the student's note.  Weott Shellhammer, MD 05/07/2024, 2:50 PM

## 2024-05-07 NOTE — Plan of Care (Signed)
   Problem: Education: Goal: Knowledge of General Education information will improve Description Including pain rating scale, medication(s)/side effects and non-pharmacologic comfort measures Outcome: Progressing   Problem: Health Behavior/Discharge Planning: Goal: Ability to manage health-related needs will improve Outcome: Progressing

## 2024-05-08 DIAGNOSIS — Z8719 Personal history of other diseases of the digestive system: Secondary | ICD-10-CM

## 2024-05-08 LAB — RENAL FUNCTION PANEL
Albumin: 3 g/dL — ABNORMAL LOW (ref 3.5–5.0)
Anion gap: 10 (ref 5–15)
BUN: 14 mg/dL (ref 6–20)
CO2: 25 mmol/L (ref 22–32)
Calcium: 8.6 mg/dL — ABNORMAL LOW (ref 8.9–10.3)
Chloride: 107 mmol/L (ref 98–111)
Creatinine, Ser: 2.51 mg/dL — ABNORMAL HIGH (ref 0.61–1.24)
GFR, Estimated: 31 mL/min — ABNORMAL LOW (ref >60)
Glucose, Bld: 92 mg/dL (ref 70–99)
Phosphorus: 4.8 mg/dL — ABNORMAL HIGH (ref 2.5–4.6)
Potassium: 4 mmol/L (ref 3.5–5.1)
Sodium: 142 mmol/L (ref 135–145)

## 2024-05-08 MED ORDER — FAMOTIDINE 20 MG PO TABS: 20.0000 mg | ORAL_TABLET | Freq: Every day | ORAL | 0 refills | Status: DC

## 2024-05-08 MED ORDER — POLYETHYLENE GLYCOL 3350 17 G PO PACK: 17.0000 g | PACK | Freq: Every day | ORAL | 2 refills | Status: DC

## 2024-05-08 NOTE — Discharge Instructions (Addendum)
 Mr. Mckenzie It was a pleasure taking care of you at [facility]. You were admitted for abdominal pain and kidney injury.  We are discharging you home now that you are doing better. Please follow the following instructions.   1) Regarding your abdominal pain, this was likely related to constipation.  Please stay hydrated and increase your fiber intake.  I have sent you home with Pepcid  and MiraLAX .  The Pepcid  is for acid reflux and MiraLAX  is for constipation.  Please take these regularly as instructed.  2) Regarding your kidney injury, your kidneys are still hurt, but you are safe to leave the hospital.  I anticipate this kidney injury was from the contrast you received with your CT scan.  Please avoid taking any medicine such as ibuprofen, naproxen, Aleve, or Advil.  You can take Tylenol  safely.  3) Regarding follow-up appointment.  I have instructed my clinic to call you.  If you do not have a phone call within the next 1 to 2 days, you can call our clinic at [phone].  In the follow-up provider section of this handout, I have put our clinic address and our phone number.  If you do not find the clinic address and phone number, it is listed below. [facility]  [address] [place], [place] [address] [place] [address]   Phone number: [phone]  Take care,  Dr. Libby Blanch, DO   If you have any other questions please contact the internal medicine clinic at [phone] If it is after hours, please call the [place] [facility] at [phone] and then ask the person who picks up for the resident on call.   Sr. Ricki Fue un placer atenderlo en [facility]. Usted fue admitido debido a dolor abdominal e insuficiencia renal. Ahora que est mejor, lo estamos dando de alta para que regrese a Hotel manager. Por favor, siga las siguientes instrucciones:  En cuanto a su dolor abdominal, esto probablemente estuvo relacionado con el estreimiento. Por  favor, mantngase hidratado y aumente su ingesta de [place]. Le he enviado a casa con Pepcid  y MiraLAX . El Pepcid  es para el reflujo cido y MiraLAX  es para el estreimiento. Por favor, tome estos medicamentos regularmente segn las instrucciones.  En cuanto a su lesin renal, sus riones todava estn afectados, pero es seguro que salga del hospital. Anticipo que esta lesin renal fue debido al contraste que recibi con su tomografa computarizada. Evite tomar medicamentos como ibuprofeno, naproxeno, Aleve o Advil. Puede tomar Tylenol  de manera segura.  En cuanto a la cita de seguimiento, he instruido a mi clnica para que lo llame. Si no recibe [employer] de los prximos 1 a 2 [place], puede llamar a latvia al [phone]. En la seccin de proveedores de seguimiento de [employer], he incluido la direccin de nuestra clnica y nuestro nmero de telfono.  Si no encuentra la direccin y el nmero de telfono de la clnica, se encuentran a continuacin:  [facility] [address] [place], [place] [address] [place] [address]  Debroah sheerer telfono: [phone]  [place], Dr. Libby Blanch, DO  Si tiene alguna otra pregunta, por favor contacte a la clnica de medicina interna al [phone]. Si es fuera de horario, llame al hospital [facility] al [phone] y pida hablar con el residente de [place].

## 2024-05-08 NOTE — Progress Notes (Signed)
 HD#1 Subjective:   Summary: This is a 48 year old male with past medical history of gastritis and constipation who presented with concerns of abdominal pain and admitted for acute kidney injury.  Overnight Events: No acute events overnight  Patient evaluated bedside this morning.  He reports he is doing well.  He reports he has had a bowel movement.  He states he would like medicine for constipation when he is discharged.  He is appreciative of the care that he was given during his hospitalization.  Objective:  Vital signs in last 24 hours: Vitals:   05/07/24 0727 05/07/24 1535 05/07/24 1938 05/08/24 0442  BP: 123/77 134/73 134/71 133/82  Pulse: (!) 58 (!) 57 (!) 59 (!) 58  Resp:   18 16  Temp: 98 F (36.7 C) 98.6 F (37 C) 98.3 F (36.8 C) 98.2 F (36.8 C)  TempSrc: Oral Oral Oral Oral  SpO2: 99% 100% 100% 99%  Weight:      Height:       Supplemental O2: Room Air SpO2: 99 %   Physical Exam:  Constitutional: Well-appearing, resting in bed, no acute distress HENT: normocephalic atraumatic Cardiovascular: regular rate and rhythm, no m/r/g Pulmonary/Chest: normal work of breathing on room air, lungs clear to auscultation bilaterally Abdominal: soft, non-tender, non-distended  Filed Weights   05/06/24 0845 05/06/24 1330  Weight: 85 kg 80.7 kg     Intake/Output Summary (Last 24 hours) at 05/08/2024 0624 Last data filed at 05/07/2024 2300 Gross per 24 hour  Intake 950 ml  Output 2500 ml  Net -1550 ml   Net IO Since Admission: -397.25 mL [05/08/24 0624]  Pertinent Labs:    Latest Ref Rng & Units 05/07/2024    5:07 AM 05/06/2024    9:04 AM 05/05/2024    7:56 AM  CBC  WBC 4.0 - 10.5 K/uL 6.2  6.9  8.8   Hemoglobin 13.0 - 17.0 g/dL 84.7  83.8  83.3   Hematocrit 39.0 - 52.0 % 43.1  46.1  46.5   Platelets 150 - 400 K/uL 185  184  202        Latest Ref Rng & Units 05/08/2024    4:52 AM 05/07/2024    5:07 AM 05/06/2024    9:04 AM  CMP  Glucose 70 - 99 mg/dL 92  857   834   BUN 6 - 20 mg/dL 14  16  20    Creatinine 0.61 - 1.24 mg/dL 7.48  7.53  7.73   Sodium 135 - 145 mmol/L 142  141  137   Potassium 3.5 - 5.1 mmol/L 4.0  3.6  3.6   Chloride 98 - 111 mmol/L 107  107  102   CO2 22 - 32 mmol/L 25  25  24    Calcium 8.9 - 10.3 mg/dL 8.6  8.6  8.8   Total Protein 6.5 - 8.1 g/dL   6.6   Total Bilirubin 0.0 - 1.2 mg/dL   0.8   Alkaline Phos 38 - 126 U/L   52   AST 15 - 41 U/L   24   ALT 0 - 44 U/L   19     Imaging: No results found.  Assessment/Plan:   Principal Problem:   AKI (acute kidney injury) [facility])   Patient Summary: Barry Zamora is a 48 y.o. This is a 48 year old male with past medical history of gastritis and constipation who presented with concerns of abdominal pain and admitted for acute kidney injury.  #AKI Serum  creatinine up to one 2.51 today.  This is up from 2.46 yesterday.  Patient has great urine output at 2.5 L yesterday.  Likely creatinine has peaked, and slowly will start coming down.  This is likely contrast-induced nephropathy.  Given great urine output, low suspicion for postobstructive.  Patient has been adequately hydrated during hospitalization, therefore prerenal causes have likely been treated. - Strict I's and O's - Encourage oral hydration - BMP this afternoon (1300) - Follow-up outpatient  #Abdominal pain Patient has had a bowel movement.  His abdominal pain has improved.  This is likely GERD or PUD.  He will continue with H2 blocker.  H. pylori test is still not been collected. - Follow-up H. pylori - Continue famotidine  20 mg daily - Continue Senokot 8.6 mg daily  #Financial barriers Patient endorsed concerns for having no insurance and wanted to know his options for paying for this hospital stay.  Reached out to social work yesterday. - Financial team has been notified, and will be in contact with patient per Social work  Diet: Normal IVF: None,None VTE: DOAC Code: Full  Dispo: Anticipated  discharge to Home in 1 days pending clinical improvment.   Libby Blanch DO Internal Medicine Resident PGY-3 Please contact the on call pager after 5 pm and on weekends at [phone].

## 2024-05-08 NOTE — Plan of Care (Signed)
  Problem: Education: Goal: Knowledge of General Education information will improve Description: Including pain rating scale, medication(s)/side effects and non-pharmacologic comfort measures Outcome: Progressing   Problem: Health Behavior/Discharge Planning: Goal: Ability to manage health-related needs will improve Outcome: Progressing   Problem: Clinical Measurements: Goal: Ability to maintain clinical measurements within normal limits will improve Outcome: Not Progressing   Problem: Clinical Measurements: Goal: Diagnostic test results will improve Outcome: Not Progressing

## 2024-05-09 ENCOUNTER — Telehealth: Payer: Self-pay

## 2024-05-09 NOTE — Telephone Encounter (Signed)
 Please refer to message.  Patient was called today via Language [employer] with spanish interpreter Camila ID# [phone].  No answer and no voicemail, unable to leave detailed message.  Due to not being able to speak with patient an appointment has been scheduled for next Wednesday 05/18/2024 at 3:45 pm with Dr. Celestina.  Appointment card has been mailed to current address on file.

## 2024-05-09 NOTE — Telephone Encounter (Signed)
-----   Message from Libby Blanch sent at 05/08/2024  1:08 PM EDT ----- Spanish speaking patient. New hospital follow up . Can we get him scheduled for an appointment within the week.

## 2024-05-18 ENCOUNTER — Ambulatory Visit: Payer: Self-pay | Admitting: Student

## 2024-07-28 NOTE — Progress Notes (Unsigned)
 Patient name: Barry Zamora Date of birth: 01/11/1976 Date of visit: 07/29/24  Type of visit: New Patient Office Visit  Subjective   Chief concern:  Chief Complaint  Patient presents with   Hospitalization Follow-up    Follow up from hospital / having pain in private area /had testing done wanting to make sure he do not have any disease but have not heard from any one. Patient states he has not had a mate for about 2 years and is desiring to have one.    Barry Zamora is a 48 y.o. male with a PMHx of urethritis/prostatitis, depression who presents to [facility] clinic to establish care as a new patient. The patient did not have a PCP prior to this encounter.  The patient is also presenting for evaluation of penile pain/dysuria.  See details below for patient's last admission. See assessment and plan for further details in problem-based format.  Follow up Hospitalization  Patient was admitted to IMTS at [facility] on 05/06/24 and discharged on 05/08/24. He was treated for constipation and AKI. Treatment for this included IV fluids and MiraLax . Telephone follow up was not done.  Please see Assessment and Plan for further details in problem-based format.  Past Medical History Depression ?Urethritis  Past Surgical History No pertinent past surgical history  Medications Denies current medications  Allergies NKDA  Family History No pertinent family history  Social History Lives: in [place] with 3 friends/coworkers Occupation: Aeronautical Engineer Function: Independent with all ADLs and IADLs PCP: None, establishing with [facility] Tobacco use: denies Alcohol use: stopped drinking 2 years ago; 12 beers/week x 3 years,  Illicit drug use: denies  Sexual history Prefers: only with women; last sexual encounter more than 2 years ago Total lifetime partners: 1, did not use protection; has children with prior partner who is in [place]  ROS Negative unless otherwise stated  in the HPI or Assessment and Plan.    Objective  Today's Vitals   07/29/24 0833 07/29/24 0841  BP: (!) 179/102 (!) 148/87  Pulse: 66 67  Temp: 98.7 F (37.1 C)   TempSrc: Oral   SpO2: 96%   Weight: 178 lb 3.2 oz (80.8 kg)   Height: 5' 9 (1.753 m)   PainSc: 6    Body mass index is 26.32 kg/m.   Physical Exam: Constitutional: well-appearing, well-nourished; normal weight; no acute distress HENT: normocephalic atraumatic, mucous membranes moist Eyes: conjunctiva non-erythematous Cardiovascular: regular rate and rhythm, no m/r/g Pulmonary/Chest: normal work of breathing on room air, lungs CTAB Abdominal: soft, non-tender, non-distended MSK: normal bulk and tone Neurological: alert & oriented x 3, no focal deficit Skin: warm and dry GU: Penis and testicles without obvious lesion, ulceration, warts.  No discharge noted.  No tenderness to palpation.  Grossly normal appearing. Extremities: BLE without edema or erythema. Psych: normal mood and behavior  Last CBC Lab Results  Component Value Date   WBC 6.2 05/07/2024   HGB 15.2 05/07/2024   HCT 43.1 05/07/2024   MCV 91.3 05/07/2024   MCH 32.2 05/07/2024   RDW 12.8 05/07/2024   PLT 185 05/07/2024   Last metabolic panel Lab Results  Component Value Date   GLUCOSE 92 05/08/2024   NA 142 05/08/2024   K 4.0 05/08/2024   CL 107 05/08/2024   CO2 25 05/08/2024   BUN 14 05/08/2024   CREATININE 2.51 (H) 05/08/2024   GFRNONAA 31 (L) 05/08/2024   CALCIUM 8.6 (L) 05/08/2024   PHOS 4.8 (H) 05/08/2024   PROT 6.6 05/06/2024  ALBUMIN 3.0 (L) 05/08/2024   BILITOT 0.8 05/06/2024   ALKPHOS 52 05/06/2024   AST 24 05/06/2024   ALT 19 05/06/2024   ANIONGAP 10 05/08/2024   Last lipids No results found for: CHOL, HDL, LDLCALC, LDLDIRECT, TRIG, CHOLHDL Last hemoglobin A1c Lab Results  Component Value Date   HGBA1C 5.3 05/06/2024      Assessment & Plan  Urethritis Assessment & Plan: Patient has had chronic  remitting-relapsing penile pain and dysuria.  He received several antibiotics since 2024 including Rocephin , doxycycline , Bactrim , ciprofloxacin , Duricef.  After chart review, was unable to see any positive testing for STDs or UA.  He was treated empirically.  Prior HIV, RPR, UA, urine culture, GC/chlamydia, mycoplasma urine were all negative.  The patient states that he has not had sexual intercourse in the past few months but is requesting further testing to make sure he is clean before he enters new sexual relationships.  The symptoms he is endorsing at this time are dysuria and penile pain for the past 3 months.  He thinks that there is a white discoloration of the skin of his urethra.  He denies urinary frequency, dysuria, hematuria, difficulty initiating stream.  On exam, there are no abnormalities on his genitourinary exam.  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  Plan to recheck STD studies, UA, urine culture, and refer back to urology. - HIV, RPR, UA, urine culture, GC/chlamydia cytology pending - Referral to urology  Orders: -     RPR -     HIV Antibody (routine testing w rflx) -     Urinalysis, Routine w reflex microscopic -     Urine Culture -     Ambulatory referral to Urology -     Urine cytology ancillary only  AKI (acute kidney injury) Assessment & Plan: During previous admission in August, the patient had AKI thought to be prerenal with FENA score of 0.7%.  Creatinine was up to 2.2.  He was treated with IV fluids.  Will follow-up BMP to monitor resolution of AKI. - Recheck BMP pending  Orders: -     Basic metabolic panel with GFR  Other acute gastritis without hemorrhage Assessment & Plan: During admission, the patient had periumbilical/RLQ pain with constipation.  CT abdomen was without any acute intra-abdominal pathology.  His pain improved with IV fluids and dicyclomine .  The  inpatient team requested H. pylori testing to be completed, however, the patient was unable to provide a stool specimen.  Since discharge, the patient has felt much better without abdominal pain or constipation.  He states that he is followed the recommended diet including alcohol abstention. Defer management of this until the next office visit, especially in the setting of financial barriers in a patient without insurance. - Consider H. pylori stool testing at next office visit   Elevated blood pressure reading Assessment & Plan: Initial blood pressure of 179/102.  Recheck value was 148/87.  Patient without history of high blood pressure.  Could be due to essential hypertension, but will defer further management until next office visit as patient has acute complaints.  Encouraged the patient to purchase a blood pressure cuff to check at home and to bring log to next office visit.   Encounter for screening involving social determinants of health [facility]) Assessment & Plan: Patient is an immigrant from [place].  He does not have insurance.  He was given information to contact [place] for further  financial resources.    Return in about 4 weeks (around 08/26/2024) for HTN/Gastritis/healthcare maintenance.  Patient case discussed with Dr. Rosan, who also saw and evaluated the patient.  Barry Weese, MD [facility]  PGY-1 07/29/2024, 4:03 PM

## 2024-07-29 ENCOUNTER — Ambulatory Visit (INDEPENDENT_AMBULATORY_CARE_PROVIDER_SITE_OTHER): Payer: Self-pay

## 2024-07-29 ENCOUNTER — Other Ambulatory Visit (HOSPITAL_COMMUNITY)
Admission: RE | Admit: 2024-07-29 | Discharge: 2024-07-29 | Disposition: A | Discharge status: Home / Self Care | Payer: Self-pay | Source: Ambulatory Visit | Attending: Internal Medicine | Admitting: Internal Medicine

## 2024-07-29 ENCOUNTER — Other Ambulatory Visit: Payer: Self-pay

## 2024-07-29 VITALS — BP 148/87 | HR 67 | Temp 98.7°F | Ht 69.0 in | Wt 178.2 lb

## 2024-07-29 DIAGNOSIS — Z8719 Personal history of other diseases of the digestive system: Secondary | ICD-10-CM

## 2024-07-29 DIAGNOSIS — Z139 Encounter for screening, unspecified: Secondary | ICD-10-CM | POA: Insufficient documentation

## 2024-07-29 DIAGNOSIS — N179 Acute kidney failure, unspecified: Secondary | ICD-10-CM

## 2024-07-29 DIAGNOSIS — R030 Elevated blood-pressure reading, without diagnosis of hypertension: Secondary | ICD-10-CM

## 2024-07-29 DIAGNOSIS — Z87448 Personal history of other diseases of urinary system: Secondary | ICD-10-CM

## 2024-07-29 DIAGNOSIS — K2900 Acute gastritis without bleeding: Secondary | ICD-10-CM

## 2024-07-29 DIAGNOSIS — R300 Dysuria: Secondary | ICD-10-CM | POA: Insufficient documentation

## 2024-07-29 DIAGNOSIS — N342 Other urethritis: Secondary | ICD-10-CM | POA: Insufficient documentation

## 2024-07-29 NOTE — Assessment & Plan Note (Signed)
 Initial blood pressure of 179/102.  Recheck value was 148/87.  Patient without history of high blood pressure.  Could be due to essential hypertension, but will defer further management until next office visit as patient has acute complaints.  Encouraged the patient to purchase a blood pressure cuff to check at home and to bring log to next office visit.

## 2024-07-29 NOTE — Assessment & Plan Note (Addendum)
 During previous admission in August, the patient had AKI thought to be prerenal with FENA score of 0.7%.  Creatinine was up to 2.2.  He was treated with IV fluids.  Will follow-up BMP to monitor resolution of AKI. - Recheck BMP pending

## 2024-07-29 NOTE — Assessment & Plan Note (Addendum)
 Patient has had chronic remitting-relapsing penile pain and dysuria.  He received several antibiotics since 2024 including Rocephin , doxycycline , Bactrim , ciprofloxacin , Duricef.  After chart review, was unable to see any positive testing for STDs or UA.  He was treated empirically.  Prior HIV, RPR, UA, urine culture, GC/chlamydia, mycoplasma urine were all negative.  The patient states that he has not had sexual intercourse in the past few months but is requesting further testing to make sure he is clean before he enters new sexual relationships.  The symptoms he is endorsing at this time are dysuria and penile pain for the past 3 months.  He thinks that there is a white discoloration of the skin of his urethra.  He denies urinary frequency, dysuria, hematuria, difficulty initiating stream.  On exam, there are no abnormalities on his genitourinary exam.  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  Plan to recheck STD studies, UA, urine culture, and refer back to urology. - HIV, RPR, UA, urine culture, GC/chlamydia cytology pending - Referral to urology

## 2024-07-29 NOTE — Assessment & Plan Note (Signed)
 Patient is an immigrant from [place].  He does not have insurance.  He was given information to contact [place] for further financial resources.

## 2024-07-29 NOTE — Assessment & Plan Note (Addendum)
 During admission, the patient had periumbilical/RLQ pain with constipation.  CT abdomen was without any acute intra-abdominal pathology.  His pain improved with IV fluids and dicyclomine .  The inpatient team requested H. pylori testing to be completed, however, the patient was unable to provide a stool specimen.  Since discharge, the patient has felt much better without abdominal pain or constipation.  He states that he is followed the recommended diet including alcohol abstention. Defer management of this until the next office visit, especially in the setting of financial barriers in a patient without insurance. - Consider H. pylori stool testing at next office visit

## 2024-07-29 NOTE — Patient Instructions (Addendum)
 Gracias, [name], por permitirnos atenderle hoy. Hoy hablamos de lo siguiente:  - Su presin arterial est elevada. Por favor, compre un tensimetro y mdase la presin en casa. Traiga sus lecturas a su prxima cita. Nos gustara verlo en 4 semanas para hablar sobre su presin arterial y otras necesidades de salud general.  - Estamos repitiendo sus anlisis de sangre y orina para engineer, manufacturing enfermedades de transmisin sexual. Tambin hemos enviado una referencia al consultorio de urologa que lo atendi anteriormente.  - Por favor, comunquese con Barry Zamora para obtener ayuda financiera.  - Tambin estamos repitiendo sus anlisis de sangre para asegurarnos de que sus riones hayan recuperado su funcin normal despus de su hospitalizacin.  He solicitado las siguientes pruebas de laboratorio:  Pruebas de laboratorio  Cultivo de orina  Perfil metablico bsico con TFG  RPR  Anticuerpos contra el VIH (con prueba refleja)  Anlisis de orina microscpico (prueba refleja)  Remisiones solicitadas hoy:  Remisin ambulatoria a Personal Assistant  He solicitado/modificado la siguiente medicacin:  Suspender la siguiente medicacin:  No hay medicacin suspendida.  Iniciar la siguiente medicacin: No se solicitaron medicamentos de los tipos definidos en esta consulta.  Seguimiento: 4 semanas  Si tiene alguna pregunta o inquietud, llame a la [employer] al [phone].  Dr. Letha Waymond [facility] ----  Thank you, Barry Zamora for allowing us  to provide your care today. Today we discussed the following:  - Your blood pressure is elevated. Please buy a blood pressure cuff and check your pressures at home. Bring your readings to your next office visit. We would like to see you in 4 weeks to discuss your blood pressure and other general healthcare needs. - We are rechecking your blood and urine for  sexually-transmitted diseases. We have also sent a referral to the Urology office you saw previously. - Please contact Barry Zamora for financial assistance. - We are also rechecking your blood work to make sure your kidneys have returned to normal function after your hospitalization.  I have ordered the following labs for you:   Lab Orders         Culture, Urine         Basic metabolic panel with GFR         RPR         HIV antibody (with reflex)         Urinalysis, Reflex Microscopic      Referrals ordered today:    Referral Orders         Ambulatory referral to Urology      I have ordered the following medication/changed the following medications:   Stop the following medications: There are no discontinued medications.   Start the following medications: No orders of the defined types were placed in this encounter.    Follow up: 4 weeks   Should you have any questions or concerns please call the Internal Medicine Clinic at [phone].     Barry Scruggs, MD [facility]

## 2024-07-30 LAB — URINALYSIS, ROUTINE W REFLEX MICROSCOPIC
Bilirubin, UA: NEGATIVE
Glucose, UA: NEGATIVE
Ketones, UA: NEGATIVE
Leukocytes,UA: NEGATIVE
Nitrite, UA: NEGATIVE
Protein,UA: NEGATIVE
RBC, UA: NEGATIVE
Specific Gravity, UA: 1.011 (ref 1.005–1.030)
Urobilinogen, Ur: 0.2 mg/dL (ref 0.2–1.0)
pH, UA: 7 (ref 5.0–7.5)

## 2024-07-30 LAB — BASIC METABOLIC PANEL WITH GFR
BUN/Creatinine Ratio: 13 (ref 9–20)
BUN: 12 mg/dL (ref 6–24)
CO2: 22 mmol/L (ref 20–29)
Calcium: 9.6 mg/dL (ref 8.7–10.2)
Chloride: 101 mmol/L (ref 96–106)
Creatinine, Ser: 0.95 mg/dL (ref 0.76–1.27)
Glucose: 104 mg/dL — ABNORMAL HIGH (ref 70–99)
Potassium: 4.3 mmol/L (ref 3.5–5.2)
Sodium: 140 mmol/L (ref 134–144)
eGFR: 99 mL/min/1.73 (ref >59)

## 2024-07-30 LAB — HIV ANTIBODY (ROUTINE TESTING W REFLEX): HIV Screen 4th Generation wRfx: NONREACTIVE

## 2024-07-30 LAB — RPR: RPR Ser Ql: NONREACTIVE

## 2024-07-31 LAB — URINE CULTURE

## 2024-08-01 LAB — MOLECULAR ANCILLARY ONLY
Chlamydia: NEGATIVE
Comment: NEGATIVE
Comment: NORMAL
Neisseria Gonorrhea: NEGATIVE

## 2024-08-04 ENCOUNTER — Ambulatory Visit: Payer: Self-pay

## 2024-08-05 NOTE — Progress Notes (Signed)
 Internal Medicine Clinic Attending  I was physically present during the key portions of the resident provided service and participated in the medical decision making of patient's management care. I reviewed pertinent patient test results.  The assessment, diagnosis, and plan were formulated together and I agree with the documentation in the resident's note.  Rosan Dayton BROCKS, DO

## 2024-08-18 ENCOUNTER — Telehealth: Payer: Self-pay

## 2024-08-18 ENCOUNTER — Ambulatory Visit: Payer: Self-pay | Admitting: Student

## 2024-08-18 ENCOUNTER — Ambulatory Visit: Payer: Self-pay

## 2024-08-18 NOTE — Telephone Encounter (Signed)
 I called the patient with a interpreter on the line. Patient stated he is having swelling and would like a antibiotic or get a pain medication until he can find a dental office to get his tooth extracted. Patient is scheduled to see Dr.Ingold this afternoon.

## 2024-08-18 NOTE — Telephone Encounter (Signed)
 Patient called the main line at [facility] after being redirected from the [facility] [place] to complete screening. While conducting the screening, patient stated he is a resident in [place]. I gave the patient the number to the [employer]). Patient stated that he is still having dental pain after leaving his appointment. I consulted with the nurse on staff and we determined that the patient should return to the clinic going to the nearest urgent care for care. Patient stated he received pills from today's appointment. Unsure if patient began treatment with current medicines.

## 2024-08-18 NOTE — Telephone Encounter (Signed)
 FYI Only or Action Required?: Action required by provider: request for appointment. Used Spanish interpreter # [id number]. Patient was last seen in primary care on 07/29/2024 by Waymond Cart, MD.  Called Nurse Triage reporting Dental Pain.  Symptoms began yesterday.  Interventions attempted: OTC medications: Tylenol .  Symptoms are: gradually worsening. Lower back tooth pain, up all night with pain.  Triage Disposition: See HCP Within 4 Hours (Or PCP Triage)  Patient/caregiver understands and will follow disposition?: Yes     Copied from CRM #[id number]. Topic: Clinical - Red Word Triage >> Aug 18, 2024  7:48 AM Marda MATSU wrote: Red Word that prompted transfer to Nurse Triage: Severe Dental Pain   Interpreter: Tita  [id number] Reason for Disposition  [1] SEVERE pain (e.g., excruciating, unable to eat, unable to do any normal activities) AND [2] not improved 2 hours after pain medicine  Answer Assessment - Initial Assessment Questions 1. LOCATION: Which tooth is hurting?  (e.g., right-side/left-side, upper/lower, front/back)     Lower back tooth 2. ONSET: When did the toothache start?  (e.g., hours, days)      Last night 3. SEVERITY: How bad is the toothache?  (Scale 1-10; mild, moderate or severe)     severe 4. SWELLING: Is there any visible swelling of your face?     no 5. OTHER SYMPTOMS: Do you have any other symptoms? (e.g., fever)     no 6. PREGNANCY: Is there any chance you are pregnant? When was your last menstrual period?     N/a  Protocols used: Toothache-A-AH

## 2024-09-02 ENCOUNTER — Ambulatory Visit: Payer: Self-pay | Admitting: Student

## 2024-09-05 ENCOUNTER — Ambulatory Visit: Payer: Self-pay

## 2024-09-12 ENCOUNTER — Ambulatory Visit: Payer: Self-pay

## 2024-10-12 ENCOUNTER — Ambulatory Visit: Payer: Self-pay

## 2024-10-12 NOTE — Telephone Encounter (Signed)
 FYI Only or Action Required?: FYI only for provider: ED advised.  Patient was last seen in primary care on 07/29/2024 by Waymond Cart, MD.  Called Nurse Triage reporting Eye Pain.  Symptoms began several weeks ago.  Interventions attempted: OTC medications: chamomille eye drops and Prescription medications: Ampicillin from [place].  Symptoms are: gradually worsening.  Triage Disposition: Go to ED Now (Notify PCP)  Patient/caregiver understands and will follow disposition?: Yes     Copied from CRM [id number]. Topic: Clinical - Red Word Triage >> Oct 12, 2024  4:26 PM Graeme ORN wrote: Red Word that prompted transfer to Nurse Triage: Eye/ Vision - feels like something is in in - itching and pain    ----------------------------------------------------------------------- From previous Reason for Contact - Scheduling: Patient/patient representative is calling to schedule an appointment. Refer to attachments for appointment information.     Reason for Disposition  [1] Eye has been washed out > 30 minutes ago AND [2] feels like FB is still present  Answer Assessment - Initial Assessment Questions 1. TYPE OF FOREIGN BODY: What got in the eye?      Like trash or some sort of pimple, worried it could be infection and will lose eye  2. ONSET: When did it happen?      Pain started 2 weeks ago in L) eye, using OTC chamomile eye drops and ampicillin from [place].   3. MECHANISM: How did it happen?     Does not remember anything blowing into face/ eye.   4. LOCATION: Which eye, where does it seem to be located? (e.g., left, right; on cornea, under lower or upper eyelid, uncertain)      Eyelid swollen  4. VISION: Do you have blurred vision?      Denies  5. PAIN: Is it painful? If Yes, ask: How bad is the pain?  (Scale 0-10; or none, mild, moderate, severe)     Yes-  2 or 3 /10  6. CONTACT LENS: Do you wear contacts?     Denies  7. OTHER SYMPTOMS: Do you have any  other symptoms? (e.g., eye discharge, eye redness)     Itching  Protocols used: Eye - Foreign Body-A-AH

## 2024-10-13 NOTE — Telephone Encounter (Signed)
 Call to patient x 2 with [facility]  [phone].  Message was left that the Clinics had returned a call about his eye problem.

## 2024-12-28 ENCOUNTER — Ambulatory Visit: Payer: Self-pay | Admitting: Student
